# Patient Record
Sex: Male | Born: 1935 | Race: White | Hispanic: No | Marital: Married | Smoking: Former smoker
Health system: Southern US, Community
[De-identification: ages and names within clinical notes are randomized; demographics above are authoritative.]

## PROBLEM LIST (undated history)

## (undated) DIAGNOSIS — Z9861 Coronary angioplasty status: Secondary | ICD-10-CM

## (undated) DIAGNOSIS — I519 Heart disease, unspecified: Secondary | ICD-10-CM

## (undated) DIAGNOSIS — E785 Hyperlipidemia, unspecified: Secondary | ICD-10-CM

## (undated) DIAGNOSIS — I10 Essential (primary) hypertension: Secondary | ICD-10-CM

## (undated) DIAGNOSIS — K219 Gastro-esophageal reflux disease without esophagitis: Secondary | ICD-10-CM

## (undated) DIAGNOSIS — C8190 Hodgkin lymphoma, unspecified, unspecified site: Secondary | ICD-10-CM

## (undated) DIAGNOSIS — I2510 Atherosclerotic heart disease of native coronary artery without angina pectoris: Secondary | ICD-10-CM

## (undated) HISTORY — DX: Coronary angioplasty status: Z98.61

## (undated) HISTORY — DX: Essential (primary) hypertension: I10

## (undated) HISTORY — PX: SHOULDER SURGERY: SHX246

## (undated) HISTORY — DX: Atherosclerotic heart disease of native coronary artery without angina pectoris: I25.10

## (undated) HISTORY — DX: Heart disease, unspecified: I51.9

## (undated) HISTORY — PX: APPENDECTOMY: SHX54

## (undated) HISTORY — DX: Gastro-esophageal reflux disease without esophagitis: K21.9

## (undated) HISTORY — DX: Hodgkin lymphoma, unspecified, unspecified site: C81.90

## (undated) HISTORY — PX: TONSILLECTOMY: SUR1361

## (undated) HISTORY — PX: CORONARY ANGIOPLASTY: SHX604

## (undated) HISTORY — DX: Hyperlipidemia, unspecified: E78.5

---
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
## 2003-01-04 HISTORY — PX: CORONARY ANGIOPLASTY: SHX604

## 2008-03-05 ENCOUNTER — Encounter: Payer: Self-pay | Admitting: Cardiology

## 2008-03-07 ENCOUNTER — Encounter: Payer: Self-pay | Admitting: Cardiology

## 2008-03-10 ENCOUNTER — Ambulatory Visit: Payer: Self-pay | Admitting: Vascular Surgery

## 2008-12-29 ENCOUNTER — Emergency Department (HOSPITAL_BASED_OUTPATIENT_CLINIC_OR_DEPARTMENT_OTHER): Admission: EM | Admit: 2008-12-29 | Discharge: 2008-12-29 | Payer: Self-pay | Admitting: Emergency Medicine

## 2008-12-29 ENCOUNTER — Ambulatory Visit: Payer: Self-pay | Admitting: Diagnostic Radiology

## 2009-01-29 ENCOUNTER — Encounter: Admission: RE | Admit: 2009-01-29 | Discharge: 2009-01-29 | Payer: Self-pay | Admitting: Family Medicine

## 2010-04-05 LAB — BASIC METABOLIC PANEL
BUN: 16 mg/dL (ref 6–23)
CO2: 29 mEq/L (ref 19–32)
Calcium: 9.3 mg/dL (ref 8.4–10.5)
Chloride: 100 mEq/L (ref 96–112)
Creatinine, Ser: 0.8 mg/dL (ref 0.4–1.5)
GFR calc Af Amer: >60 mL/min (ref >60)
GFR calc non Af Amer: >60 mL/min (ref >60)
Glucose, Bld: 92 mg/dL (ref 70–99)
Potassium: 4 mEq/L (ref 3.5–5.1)
Sodium: 141 mEq/L (ref 135–145)

## 2010-04-05 LAB — DIFFERENTIAL
Basophils Absolute: 0.1 10*3/uL (ref 0.0–0.1)
Basophils Relative: 1 % (ref 0–1)
Eosinophils Absolute: 0.3 10*3/uL (ref 0.0–0.7)
Eosinophils Relative: 4 % (ref 0–5)
Lymphocytes Relative: 25 % (ref 12–46)
Lymphs Abs: 1.8 10*3/uL (ref 0.7–4.0)
Monocytes Absolute: 0.7 10*3/uL (ref 0.1–1.0)
Monocytes Relative: 10 % (ref 3–12)
Neutro Abs: 4.3 10*3/uL (ref 1.7–7.7)
Neutrophils Relative %: 60 % (ref 43–77)

## 2010-04-05 LAB — POCT CARDIAC MARKERS
CKMB, poc: 1.6 ng/mL (ref 1.0–8.0)
Myoglobin, poc: 72.8 ng/mL (ref 12–200)
Troponin i, poc: <0.05 ng/mL (ref 0.00–0.09)

## 2010-04-05 LAB — CBC
HCT: 44.4 % (ref 39.0–52.0)
Hemoglobin: 14.8 g/dL (ref 13.0–17.0)
MCHC: 33.3 g/dL (ref 30.0–36.0)
MCV: 93 fL (ref 78.0–100.0)
Platelets: 169 10*3/uL (ref 150–400)
RBC: 4.77 MIL/uL (ref 4.22–5.81)
RDW: 13.2 % (ref 11.5–15.5)
WBC: 7.2 10*3/uL (ref 4.0–10.5)

## 2010-05-18 NOTE — Procedures (Signed)
CAROTID DUPLEX EXAM   INDICATION:  Carotid bruit.   HISTORY:  Diabetes:  No.  Cardiac:  Stent.  Hypertension:  Yes.  Smoking:  Quit approximately 50 years ago.  Previous Surgery:  No carotid surgery.  CV History:  No.  Amaurosis Fugax No, Paresthesias No, Hemiparesis No.                                       RIGHT             LEFT  Brachial systolic pressure:         144               140  Brachial Doppler waveforms:         WNL               WNL  Vertebral direction of flow:        Antegrade         Antegrade  DUPLEX VELOCITIES (cm/sec)  CCA peak systolic                   83                133  ECA peak systolic                   137               119  ICA peak systolic                   147               89  ICA end diastolic                   41                18  PLAQUE MORPHOLOGY:                  Heterogenous      Heterogenous  PLAQUE AMOUNT:                      Mild/moderate     Mild  PLAQUE LOCATION:                    ICA, ECA          ICA, ECA   IMPRESSION:  1. Right internal carotid artery shows evidence of 40-59% stenosis.  2. Left internal carotid artery shows evidence of 20-39% stenosis.     ___________________________________________  Janetta Hora Fields, MD   AS/MEDQ  D:  03/10/2008  T:  03/10/2008  Job:  [id number]

## 2011-06-04 ENCOUNTER — Encounter: Payer: Self-pay | Admitting: *Deleted

## 2011-11-07 ENCOUNTER — Encounter: Payer: Self-pay | Admitting: Vascular Surgery

## 2015-01-08 DIAGNOSIS — M7742 Metatarsalgia, left foot: Secondary | ICD-10-CM | POA: Diagnosis not present

## 2015-01-08 DIAGNOSIS — M79675 Pain in left toe(s): Secondary | ICD-10-CM | POA: Diagnosis not present

## 2015-01-08 DIAGNOSIS — R7303 Prediabetes: Secondary | ICD-10-CM | POA: Diagnosis not present

## 2015-01-08 DIAGNOSIS — M1990 Unspecified osteoarthritis, unspecified site: Secondary | ICD-10-CM | POA: Diagnosis not present

## 2015-01-08 DIAGNOSIS — I10 Essential (primary) hypertension: Secondary | ICD-10-CM | POA: Diagnosis not present

## 2015-01-08 DIAGNOSIS — L602 Onychogryphosis: Secondary | ICD-10-CM | POA: Diagnosis not present

## 2015-01-27 DIAGNOSIS — G4733 Obstructive sleep apnea (adult) (pediatric): Secondary | ICD-10-CM | POA: Diagnosis not present

## 2015-02-06 DIAGNOSIS — Z888 Allergy status to other drugs, medicaments and biological substances status: Secondary | ICD-10-CM | POA: Diagnosis not present

## 2015-02-06 DIAGNOSIS — Z882 Allergy status to sulfonamides status: Secondary | ICD-10-CM | POA: Diagnosis not present

## 2015-02-06 DIAGNOSIS — G8929 Other chronic pain: Secondary | ICD-10-CM | POA: Diagnosis not present

## 2015-02-06 DIAGNOSIS — Z79899 Other long term (current) drug therapy: Secondary | ICD-10-CM | POA: Diagnosis not present

## 2015-02-06 DIAGNOSIS — R471 Dysarthria and anarthria: Secondary | ICD-10-CM | POA: Diagnosis not present

## 2015-02-06 DIAGNOSIS — Z7982 Long term (current) use of aspirin: Secondary | ICD-10-CM | POA: Diagnosis not present

## 2015-02-06 DIAGNOSIS — E785 Hyperlipidemia, unspecified: Secondary | ICD-10-CM | POA: Diagnosis not present

## 2015-02-06 DIAGNOSIS — R410 Disorientation, unspecified: Secondary | ICD-10-CM | POA: Diagnosis not present

## 2015-02-06 DIAGNOSIS — N3281 Overactive bladder: Secondary | ICD-10-CM | POA: Diagnosis not present

## 2015-02-06 DIAGNOSIS — I6523 Occlusion and stenosis of bilateral carotid arteries: Secondary | ICD-10-CM | POA: Diagnosis not present

## 2015-02-06 DIAGNOSIS — I252 Old myocardial infarction: Secondary | ICD-10-CM | POA: Diagnosis not present

## 2015-02-06 DIAGNOSIS — I080 Rheumatic disorders of both mitral and aortic valves: Secondary | ICD-10-CM | POA: Diagnosis not present

## 2015-02-06 DIAGNOSIS — Z87891 Personal history of nicotine dependence: Secondary | ICD-10-CM | POA: Diagnosis not present

## 2015-02-06 DIAGNOSIS — G319 Degenerative disease of nervous system, unspecified: Secondary | ICD-10-CM | POA: Diagnosis not present

## 2015-02-06 DIAGNOSIS — G40909 Epilepsy, unspecified, not intractable, without status epilepticus: Secondary | ICD-10-CM | POA: Diagnosis not present

## 2015-02-06 DIAGNOSIS — K219 Gastro-esophageal reflux disease without esophagitis: Secondary | ICD-10-CM | POA: Diagnosis not present

## 2015-02-06 DIAGNOSIS — R9082 White matter disease, unspecified: Secondary | ICD-10-CM | POA: Diagnosis not present

## 2015-02-06 DIAGNOSIS — M545 Low back pain: Secondary | ICD-10-CM | POA: Diagnosis not present

## 2015-02-06 DIAGNOSIS — Z8673 Personal history of transient ischemic attack (TIA), and cerebral infarction without residual deficits: Secondary | ICD-10-CM | POA: Diagnosis not present

## 2015-02-06 DIAGNOSIS — I10 Essential (primary) hypertension: Secondary | ICD-10-CM | POA: Diagnosis not present

## 2015-02-06 DIAGNOSIS — R569 Unspecified convulsions: Secondary | ICD-10-CM | POA: Diagnosis not present

## 2015-02-06 DIAGNOSIS — Z886 Allergy status to analgesic agent status: Secondary | ICD-10-CM | POA: Diagnosis not present

## 2015-02-06 DIAGNOSIS — G459 Transient cerebral ischemic attack, unspecified: Secondary | ICD-10-CM | POA: Diagnosis not present

## 2015-02-06 DIAGNOSIS — R402411 Glasgow coma scale score 13-15, in the field [EMT or ambulance]: Secondary | ICD-10-CM | POA: Diagnosis not present

## 2015-02-06 DIAGNOSIS — G451 Carotid artery syndrome (hemispheric): Secondary | ICD-10-CM | POA: Diagnosis not present

## 2015-02-06 DIAGNOSIS — M1990 Unspecified osteoarthritis, unspecified site: Secondary | ICD-10-CM | POA: Diagnosis not present

## 2015-02-06 DIAGNOSIS — G40209 Localization-related (focal) (partial) symptomatic epilepsy and epileptic syndromes with complex partial seizures, not intractable, without status epilepticus: Secondary | ICD-10-CM | POA: Diagnosis not present

## 2015-02-06 DIAGNOSIS — C8190 Hodgkin lymphoma, unspecified, unspecified site: Secondary | ICD-10-CM | POA: Diagnosis not present

## 2015-02-06 DIAGNOSIS — J309 Allergic rhinitis, unspecified: Secondary | ICD-10-CM | POA: Diagnosis not present

## 2015-02-06 DIAGNOSIS — R4781 Slurred speech: Secondary | ICD-10-CM | POA: Diagnosis not present

## 2015-02-06 DIAGNOSIS — Z8669 Personal history of other diseases of the nervous system and sense organs: Secondary | ICD-10-CM | POA: Diagnosis not present

## 2015-02-06 DIAGNOSIS — I2510 Atherosclerotic heart disease of native coronary artery without angina pectoris: Secondary | ICD-10-CM | POA: Diagnosis not present

## 2015-02-06 DIAGNOSIS — G250 Essential tremor: Secondary | ICD-10-CM | POA: Diagnosis not present

## 2015-02-06 DIAGNOSIS — N400 Enlarged prostate without lower urinary tract symptoms: Secondary | ICD-10-CM | POA: Diagnosis not present

## 2015-02-06 DIAGNOSIS — Z885 Allergy status to narcotic agent status: Secondary | ICD-10-CM | POA: Diagnosis not present

## 2015-02-06 DIAGNOSIS — G4733 Obstructive sleep apnea (adult) (pediatric): Secondary | ICD-10-CM | POA: Diagnosis not present

## 2015-02-06 DIAGNOSIS — R4789 Other speech disturbances: Secondary | ICD-10-CM | POA: Diagnosis not present

## 2015-02-07 DIAGNOSIS — R4789 Other speech disturbances: Secondary | ICD-10-CM | POA: Diagnosis not present

## 2015-02-07 DIAGNOSIS — I519 Heart disease, unspecified: Secondary | ICD-10-CM | POA: Diagnosis not present

## 2015-02-07 DIAGNOSIS — I083 Combined rheumatic disorders of mitral, aortic and tricuspid valves: Secondary | ICD-10-CM | POA: Diagnosis not present

## 2015-02-07 DIAGNOSIS — J341 Cyst and mucocele of nose and nasal sinus: Secondary | ICD-10-CM | POA: Diagnosis not present

## 2015-02-07 DIAGNOSIS — M5440 Lumbago with sciatica, unspecified side: Secondary | ICD-10-CM | POA: Diagnosis not present

## 2015-02-07 DIAGNOSIS — I639 Cerebral infarction, unspecified: Secondary | ICD-10-CM | POA: Diagnosis not present

## 2015-02-07 DIAGNOSIS — G459 Transient cerebral ischemic attack, unspecified: Secondary | ICD-10-CM | POA: Diagnosis not present

## 2015-02-07 DIAGNOSIS — G8929 Other chronic pain: Secondary | ICD-10-CM | POA: Diagnosis not present

## 2015-02-07 DIAGNOSIS — I10 Essential (primary) hypertension: Secondary | ICD-10-CM | POA: Diagnosis not present

## 2015-02-07 DIAGNOSIS — I2510 Atherosclerotic heart disease of native coronary artery without angina pectoris: Secondary | ICD-10-CM | POA: Diagnosis not present

## 2015-02-07 DIAGNOSIS — Z9861 Coronary angioplasty status: Secondary | ICD-10-CM | POA: Diagnosis not present

## 2015-02-07 DIAGNOSIS — I517 Cardiomegaly: Secondary | ICD-10-CM | POA: Diagnosis not present

## 2015-02-11 DIAGNOSIS — G451 Carotid artery syndrome (hemispheric): Secondary | ICD-10-CM | POA: Diagnosis not present

## 2015-02-11 DIAGNOSIS — I10 Essential (primary) hypertension: Secondary | ICD-10-CM | POA: Diagnosis not present

## 2015-02-11 DIAGNOSIS — F419 Anxiety disorder, unspecified: Secondary | ICD-10-CM | POA: Diagnosis not present

## 2015-02-11 DIAGNOSIS — K219 Gastro-esophageal reflux disease without esophagitis: Secondary | ICD-10-CM | POA: Diagnosis not present

## 2015-02-18 DIAGNOSIS — G451 Carotid artery syndrome (hemispheric): Secondary | ICD-10-CM | POA: Diagnosis not present

## 2015-02-18 DIAGNOSIS — I6521 Occlusion and stenosis of right carotid artery: Secondary | ICD-10-CM | POA: Diagnosis not present

## 2015-02-18 DIAGNOSIS — I10 Essential (primary) hypertension: Secondary | ICD-10-CM | POA: Diagnosis not present

## 2015-03-24 DIAGNOSIS — I10 Essential (primary) hypertension: Secondary | ICD-10-CM | POA: Diagnosis not present

## 2015-03-24 DIAGNOSIS — G250 Essential tremor: Secondary | ICD-10-CM | POA: Diagnosis not present

## 2015-03-24 DIAGNOSIS — I6521 Occlusion and stenosis of right carotid artery: Secondary | ICD-10-CM | POA: Diagnosis not present

## 2015-03-25 DIAGNOSIS — H35373 Puckering of macula, bilateral: Secondary | ICD-10-CM | POA: Diagnosis not present

## 2015-03-25 DIAGNOSIS — H5712 Ocular pain, left eye: Secondary | ICD-10-CM | POA: Diagnosis not present

## 2015-03-25 DIAGNOSIS — H401121 Primary open-angle glaucoma, left eye, mild stage: Secondary | ICD-10-CM | POA: Diagnosis not present

## 2015-03-25 DIAGNOSIS — H40011 Open angle with borderline findings, low risk, right eye: Secondary | ICD-10-CM | POA: Diagnosis not present

## 2015-03-25 DIAGNOSIS — H04123 Dry eye syndrome of bilateral lacrimal glands: Secondary | ICD-10-CM | POA: Diagnosis not present

## 2015-04-02 DIAGNOSIS — S90121A Contusion of right lesser toe(s) without damage to nail, initial encounter: Secondary | ICD-10-CM | POA: Diagnosis not present

## 2015-04-02 DIAGNOSIS — M79671 Pain in right foot: Secondary | ICD-10-CM | POA: Diagnosis not present

## 2015-04-03 DIAGNOSIS — M25641 Stiffness of right hand, not elsewhere classified: Secondary | ICD-10-CM | POA: Diagnosis not present

## 2015-04-03 DIAGNOSIS — E785 Hyperlipidemia, unspecified: Secondary | ICD-10-CM | POA: Diagnosis not present

## 2015-04-03 DIAGNOSIS — I2510 Atherosclerotic heart disease of native coronary artery without angina pectoris: Secondary | ICD-10-CM | POA: Diagnosis not present

## 2015-04-03 DIAGNOSIS — Z8673 Personal history of transient ischemic attack (TIA), and cerebral infarction without residual deficits: Secondary | ICD-10-CM | POA: Diagnosis not present

## 2015-04-03 DIAGNOSIS — Z87891 Personal history of nicotine dependence: Secondary | ICD-10-CM | POA: Diagnosis not present

## 2015-04-03 DIAGNOSIS — G8929 Other chronic pain: Secondary | ICD-10-CM | POA: Diagnosis not present

## 2015-04-03 DIAGNOSIS — Z888 Allergy status to other drugs, medicaments and biological substances status: Secondary | ICD-10-CM | POA: Diagnosis not present

## 2015-04-03 DIAGNOSIS — R569 Unspecified convulsions: Secondary | ICD-10-CM | POA: Diagnosis not present

## 2015-04-03 DIAGNOSIS — I10 Essential (primary) hypertension: Secondary | ICD-10-CM | POA: Diagnosis not present

## 2015-04-03 DIAGNOSIS — Z7902 Long term (current) use of antithrombotics/antiplatelets: Secondary | ICD-10-CM | POA: Diagnosis not present

## 2015-04-03 DIAGNOSIS — I252 Old myocardial infarction: Secondary | ICD-10-CM | POA: Diagnosis not present

## 2015-04-03 DIAGNOSIS — Z7982 Long term (current) use of aspirin: Secondary | ICD-10-CM | POA: Diagnosis not present

## 2015-04-03 DIAGNOSIS — M545 Low back pain: Secondary | ICD-10-CM | POA: Diagnosis not present

## 2015-04-03 DIAGNOSIS — I452 Bifascicular block: Secondary | ICD-10-CM | POA: Diagnosis not present

## 2015-04-03 DIAGNOSIS — J322 Chronic ethmoidal sinusitis: Secondary | ICD-10-CM | POA: Diagnosis not present

## 2015-04-03 DIAGNOSIS — G4730 Sleep apnea, unspecified: Secondary | ICD-10-CM | POA: Diagnosis not present

## 2015-04-03 DIAGNOSIS — G459 Transient cerebral ischemic attack, unspecified: Secondary | ICD-10-CM | POA: Diagnosis not present

## 2015-04-03 DIAGNOSIS — N400 Enlarged prostate without lower urinary tract symptoms: Secondary | ICD-10-CM | POA: Diagnosis not present

## 2015-04-03 DIAGNOSIS — Z96651 Presence of right artificial knee joint: Secondary | ICD-10-CM | POA: Diagnosis not present

## 2015-04-03 DIAGNOSIS — K219 Gastro-esophageal reflux disease without esophagitis: Secondary | ICD-10-CM | POA: Diagnosis not present

## 2015-04-03 DIAGNOSIS — Z9049 Acquired absence of other specified parts of digestive tract: Secondary | ICD-10-CM | POA: Diagnosis not present

## 2015-04-03 DIAGNOSIS — R079 Chest pain, unspecified: Secondary | ICD-10-CM | POA: Diagnosis not present

## 2015-04-03 DIAGNOSIS — Z7951 Long term (current) use of inhaled steroids: Secondary | ICD-10-CM | POA: Diagnosis not present

## 2015-04-03 DIAGNOSIS — G250 Essential tremor: Secondary | ICD-10-CM | POA: Diagnosis not present

## 2015-04-03 DIAGNOSIS — Z79899 Other long term (current) drug therapy: Secondary | ICD-10-CM | POA: Diagnosis not present

## 2015-04-03 DIAGNOSIS — R410 Disorientation, unspecified: Secondary | ICD-10-CM | POA: Diagnosis not present

## 2015-04-03 DIAGNOSIS — Z955 Presence of coronary angioplasty implant and graft: Secondary | ICD-10-CM | POA: Diagnosis not present

## 2015-04-03 DIAGNOSIS — Z8572 Personal history of non-Hodgkin lymphomas: Secondary | ICD-10-CM | POA: Diagnosis not present

## 2015-04-03 DIAGNOSIS — G40209 Localization-related (focal) (partial) symptomatic epilepsy and epileptic syndromes with complex partial seizures, not intractable, without status epilepticus: Secondary | ICD-10-CM | POA: Diagnosis not present

## 2015-04-04 DIAGNOSIS — M25641 Stiffness of right hand, not elsewhere classified: Secondary | ICD-10-CM | POA: Diagnosis not present

## 2015-04-04 DIAGNOSIS — I517 Cardiomegaly: Secondary | ICD-10-CM | POA: Diagnosis not present

## 2015-04-04 DIAGNOSIS — G40209 Localization-related (focal) (partial) symptomatic epilepsy and epileptic syndromes with complex partial seizures, not intractable, without status epilepticus: Secondary | ICD-10-CM | POA: Diagnosis not present

## 2015-04-04 DIAGNOSIS — I519 Heart disease, unspecified: Secondary | ICD-10-CM | POA: Diagnosis not present

## 2015-04-04 DIAGNOSIS — I083 Combined rheumatic disorders of mitral, aortic and tricuspid valves: Secondary | ICD-10-CM | POA: Diagnosis not present

## 2015-04-04 DIAGNOSIS — R4189 Other symptoms and signs involving cognitive functions and awareness: Secondary | ICD-10-CM | POA: Diagnosis not present

## 2015-04-07 DIAGNOSIS — M722 Plantar fascial fibromatosis: Secondary | ICD-10-CM | POA: Diagnosis not present

## 2015-04-07 DIAGNOSIS — M76821 Posterior tibial tendinitis, right leg: Secondary | ICD-10-CM | POA: Diagnosis not present

## 2015-04-09 DIAGNOSIS — I2510 Atherosclerotic heart disease of native coronary artery without angina pectoris: Secondary | ICD-10-CM | POA: Diagnosis not present

## 2015-04-09 DIAGNOSIS — G4733 Obstructive sleep apnea (adult) (pediatric): Secondary | ICD-10-CM | POA: Diagnosis not present

## 2015-04-09 DIAGNOSIS — G250 Essential tremor: Secondary | ICD-10-CM | POA: Diagnosis not present

## 2015-04-09 DIAGNOSIS — Z9989 Dependence on other enabling machines and devices: Secondary | ICD-10-CM | POA: Diagnosis not present

## 2015-04-09 DIAGNOSIS — G40209 Localization-related (focal) (partial) symptomatic epilepsy and epileptic syndromes with complex partial seizures, not intractable, without status epilepticus: Secondary | ICD-10-CM | POA: Diagnosis not present

## 2015-04-09 DIAGNOSIS — Z9861 Coronary angioplasty status: Secondary | ICD-10-CM | POA: Diagnosis not present

## 2015-04-09 DIAGNOSIS — K5909 Other constipation: Secondary | ICD-10-CM | POA: Diagnosis not present

## 2015-04-15 DIAGNOSIS — B9789 Other viral agents as the cause of diseases classified elsewhere: Secondary | ICD-10-CM | POA: Diagnosis not present

## 2015-04-15 DIAGNOSIS — J069 Acute upper respiratory infection, unspecified: Secondary | ICD-10-CM | POA: Diagnosis not present

## 2015-06-01 DIAGNOSIS — G40209 Localization-related (focal) (partial) symptomatic epilepsy and epileptic syndromes with complex partial seizures, not intractable, without status epilepticus: Secondary | ICD-10-CM | POA: Diagnosis not present

## 2015-06-01 DIAGNOSIS — I10 Essential (primary) hypertension: Secondary | ICD-10-CM | POA: Diagnosis not present

## 2015-06-01 DIAGNOSIS — R569 Unspecified convulsions: Secondary | ICD-10-CM | POA: Diagnosis not present

## 2015-06-01 DIAGNOSIS — G8929 Other chronic pain: Secondary | ICD-10-CM | POA: Diagnosis not present

## 2015-06-01 DIAGNOSIS — M545 Low back pain: Secondary | ICD-10-CM | POA: Diagnosis not present

## 2015-06-01 DIAGNOSIS — R253 Fasciculation: Secondary | ICD-10-CM | POA: Diagnosis not present

## 2015-06-01 DIAGNOSIS — I2510 Atherosclerotic heart disease of native coronary artery without angina pectoris: Secondary | ICD-10-CM | POA: Diagnosis not present

## 2015-06-01 DIAGNOSIS — Z7902 Long term (current) use of antithrombotics/antiplatelets: Secondary | ICD-10-CM | POA: Diagnosis not present

## 2015-06-01 DIAGNOSIS — K219 Gastro-esophageal reflux disease without esophagitis: Secondary | ICD-10-CM | POA: Diagnosis not present

## 2015-06-01 DIAGNOSIS — Z87891 Personal history of nicotine dependence: Secondary | ICD-10-CM | POA: Diagnosis not present

## 2015-06-01 DIAGNOSIS — G514 Facial myokymia: Secondary | ICD-10-CM | POA: Diagnosis not present

## 2015-06-01 DIAGNOSIS — R410 Disorientation, unspecified: Secondary | ICD-10-CM | POA: Diagnosis not present

## 2015-06-01 DIAGNOSIS — I639 Cerebral infarction, unspecified: Secondary | ICD-10-CM | POA: Diagnosis not present

## 2015-06-01 DIAGNOSIS — N400 Enlarged prostate without lower urinary tract symptoms: Secondary | ICD-10-CM | POA: Diagnosis not present

## 2015-06-01 DIAGNOSIS — R531 Weakness: Secondary | ICD-10-CM | POA: Diagnosis not present

## 2015-06-01 DIAGNOSIS — G459 Transient cerebral ischemic attack, unspecified: Secondary | ICD-10-CM | POA: Diagnosis not present

## 2015-06-01 DIAGNOSIS — I252 Old myocardial infarction: Secondary | ICD-10-CM | POA: Diagnosis not present

## 2015-06-01 DIAGNOSIS — Z7951 Long term (current) use of inhaled steroids: Secondary | ICD-10-CM | POA: Diagnosis not present

## 2015-06-01 DIAGNOSIS — Z79899 Other long term (current) drug therapy: Secondary | ICD-10-CM | POA: Diagnosis not present

## 2015-06-01 DIAGNOSIS — G4733 Obstructive sleep apnea (adult) (pediatric): Secondary | ICD-10-CM | POA: Diagnosis not present

## 2015-06-01 DIAGNOSIS — Z955 Presence of coronary angioplasty implant and graft: Secondary | ICD-10-CM | POA: Diagnosis not present

## 2015-06-01 DIAGNOSIS — R29898 Other symptoms and signs involving the musculoskeletal system: Secondary | ICD-10-CM | POA: Diagnosis not present

## 2015-06-01 DIAGNOSIS — Z888 Allergy status to other drugs, medicaments and biological substances status: Secondary | ICD-10-CM | POA: Diagnosis not present

## 2015-06-01 DIAGNOSIS — R202 Paresthesia of skin: Secondary | ICD-10-CM | POA: Diagnosis not present

## 2015-06-01 DIAGNOSIS — Z951 Presence of aortocoronary bypass graft: Secondary | ICD-10-CM | POA: Diagnosis not present

## 2015-06-01 DIAGNOSIS — Z8571 Personal history of Hodgkin lymphoma: Secondary | ICD-10-CM | POA: Diagnosis not present

## 2015-06-01 DIAGNOSIS — R079 Chest pain, unspecified: Secondary | ICD-10-CM | POA: Diagnosis not present

## 2015-06-01 DIAGNOSIS — E785 Hyperlipidemia, unspecified: Secondary | ICD-10-CM | POA: Diagnosis not present

## 2015-06-01 DIAGNOSIS — Z885 Allergy status to narcotic agent status: Secondary | ICD-10-CM | POA: Diagnosis not present

## 2015-06-01 DIAGNOSIS — Z7982 Long term (current) use of aspirin: Secondary | ICD-10-CM | POA: Diagnosis not present

## 2015-06-01 DIAGNOSIS — R258 Other abnormal involuntary movements: Secondary | ICD-10-CM | POA: Diagnosis not present

## 2015-06-01 DIAGNOSIS — N3281 Overactive bladder: Secondary | ICD-10-CM | POA: Diagnosis not present

## 2015-06-02 DIAGNOSIS — R531 Weakness: Secondary | ICD-10-CM | POA: Diagnosis not present

## 2015-06-02 DIAGNOSIS — R253 Fasciculation: Secondary | ICD-10-CM | POA: Diagnosis not present

## 2015-06-02 DIAGNOSIS — G514 Facial myokymia: Secondary | ICD-10-CM | POA: Diagnosis not present

## 2015-06-02 DIAGNOSIS — G40209 Localization-related (focal) (partial) symptomatic epilepsy and epileptic syndromes with complex partial seizures, not intractable, without status epilepticus: Secondary | ICD-10-CM | POA: Diagnosis not present

## 2015-06-02 DIAGNOSIS — R258 Other abnormal involuntary movements: Secondary | ICD-10-CM | POA: Diagnosis not present

## 2015-06-02 DIAGNOSIS — G40219 Localization-related (focal) (partial) symptomatic epilepsy and epileptic syndromes with complex partial seizures, intractable, without status epilepticus: Secondary | ICD-10-CM | POA: Diagnosis not present

## 2015-06-16 DIAGNOSIS — G8929 Other chronic pain: Secondary | ICD-10-CM | POA: Diagnosis not present

## 2015-06-16 DIAGNOSIS — M545 Low back pain: Secondary | ICD-10-CM | POA: Diagnosis not present

## 2015-06-16 DIAGNOSIS — M47816 Spondylosis without myelopathy or radiculopathy, lumbar region: Secondary | ICD-10-CM | POA: Diagnosis not present

## 2015-06-22 DIAGNOSIS — G4733 Obstructive sleep apnea (adult) (pediatric): Secondary | ICD-10-CM | POA: Diagnosis not present

## 2015-06-27 DIAGNOSIS — Z885 Allergy status to narcotic agent status: Secondary | ICD-10-CM | POA: Diagnosis not present

## 2015-06-27 DIAGNOSIS — I2510 Atherosclerotic heart disease of native coronary artery without angina pectoris: Secondary | ICD-10-CM | POA: Diagnosis not present

## 2015-06-27 DIAGNOSIS — Z7951 Long term (current) use of inhaled steroids: Secondary | ICD-10-CM | POA: Diagnosis not present

## 2015-06-27 DIAGNOSIS — Z79899 Other long term (current) drug therapy: Secondary | ICD-10-CM | POA: Diagnosis not present

## 2015-06-27 DIAGNOSIS — G4730 Sleep apnea, unspecified: Secondary | ICD-10-CM | POA: Diagnosis not present

## 2015-06-27 DIAGNOSIS — I252 Old myocardial infarction: Secondary | ICD-10-CM | POA: Diagnosis not present

## 2015-06-27 DIAGNOSIS — Z886 Allergy status to analgesic agent status: Secondary | ICD-10-CM | POA: Diagnosis not present

## 2015-06-27 DIAGNOSIS — Z8673 Personal history of transient ischemic attack (TIA), and cerebral infarction without residual deficits: Secondary | ICD-10-CM | POA: Diagnosis not present

## 2015-06-27 DIAGNOSIS — I10 Essential (primary) hypertension: Secondary | ICD-10-CM | POA: Diagnosis not present

## 2015-06-27 DIAGNOSIS — R569 Unspecified convulsions: Secondary | ICD-10-CM | POA: Diagnosis not present

## 2015-06-27 DIAGNOSIS — H748X3 Other specified disorders of middle ear and mastoid, bilateral: Secondary | ICD-10-CM | POA: Diagnosis not present

## 2015-06-27 DIAGNOSIS — Z8571 Personal history of Hodgkin lymphoma: Secondary | ICD-10-CM | POA: Diagnosis not present

## 2015-06-27 DIAGNOSIS — Z87891 Personal history of nicotine dependence: Secondary | ICD-10-CM | POA: Diagnosis not present

## 2015-06-27 DIAGNOSIS — G40209 Localization-related (focal) (partial) symptomatic epilepsy and epileptic syndromes with complex partial seizures, not intractable, without status epilepticus: Secondary | ICD-10-CM | POA: Diagnosis not present

## 2015-06-27 DIAGNOSIS — R410 Disorientation, unspecified: Secondary | ICD-10-CM | POA: Diagnosis not present

## 2015-06-27 DIAGNOSIS — R251 Tremor, unspecified: Secondary | ICD-10-CM | POA: Diagnosis not present

## 2015-06-27 DIAGNOSIS — Z7902 Long term (current) use of antithrombotics/antiplatelets: Secondary | ICD-10-CM | POA: Diagnosis not present

## 2015-06-27 DIAGNOSIS — Z96651 Presence of right artificial knee joint: Secondary | ICD-10-CM | POA: Diagnosis not present

## 2015-06-27 DIAGNOSIS — E785 Hyperlipidemia, unspecified: Secondary | ICD-10-CM | POA: Diagnosis not present

## 2015-06-27 DIAGNOSIS — M545 Low back pain: Secondary | ICD-10-CM | POA: Diagnosis not present

## 2015-06-27 DIAGNOSIS — G8929 Other chronic pain: Secondary | ICD-10-CM | POA: Diagnosis not present

## 2015-06-27 DIAGNOSIS — K219 Gastro-esophageal reflux disease without esophagitis: Secondary | ICD-10-CM | POA: Diagnosis not present

## 2015-06-27 DIAGNOSIS — Z951 Presence of aortocoronary bypass graft: Secondary | ICD-10-CM | POA: Diagnosis not present

## 2015-06-27 DIAGNOSIS — R29810 Facial weakness: Secondary | ICD-10-CM | POA: Diagnosis not present

## 2015-06-27 DIAGNOSIS — N400 Enlarged prostate without lower urinary tract symptoms: Secondary | ICD-10-CM | POA: Diagnosis not present

## 2015-06-27 DIAGNOSIS — Z888 Allergy status to other drugs, medicaments and biological substances status: Secondary | ICD-10-CM | POA: Diagnosis not present

## 2015-06-27 DIAGNOSIS — E871 Hypo-osmolality and hyponatremia: Secondary | ICD-10-CM | POA: Diagnosis not present

## 2015-06-30 DIAGNOSIS — G40109 Localization-related (focal) (partial) symptomatic epilepsy and epileptic syndromes with simple partial seizures, not intractable, without status epilepticus: Secondary | ICD-10-CM | POA: Diagnosis not present

## 2015-06-30 DIAGNOSIS — G250 Essential tremor: Secondary | ICD-10-CM | POA: Diagnosis not present

## 2015-07-15 DIAGNOSIS — N400 Enlarged prostate without lower urinary tract symptoms: Secondary | ICD-10-CM | POA: Diagnosis not present

## 2015-07-15 DIAGNOSIS — G250 Essential tremor: Secondary | ICD-10-CM | POA: Diagnosis not present

## 2015-07-15 DIAGNOSIS — E871 Hypo-osmolality and hyponatremia: Secondary | ICD-10-CM | POA: Diagnosis not present

## 2015-07-15 DIAGNOSIS — R351 Nocturia: Secondary | ICD-10-CM | POA: Diagnosis not present

## 2015-07-15 DIAGNOSIS — G40209 Localization-related (focal) (partial) symptomatic epilepsy and epileptic syndromes with complex partial seizures, not intractable, without status epilepticus: Secondary | ICD-10-CM | POA: Diagnosis not present

## 2015-07-15 DIAGNOSIS — T424X5A Adverse effect of benzodiazepines, initial encounter: Secondary | ICD-10-CM | POA: Diagnosis not present

## 2015-07-15 DIAGNOSIS — I10 Essential (primary) hypertension: Secondary | ICD-10-CM | POA: Diagnosis not present

## 2015-07-17 DIAGNOSIS — G250 Essential tremor: Secondary | ICD-10-CM | POA: Diagnosis not present

## 2015-07-17 DIAGNOSIS — Z85828 Personal history of other malignant neoplasm of skin: Secondary | ICD-10-CM | POA: Diagnosis not present

## 2015-07-17 DIAGNOSIS — Z7902 Long term (current) use of antithrombotics/antiplatelets: Secondary | ICD-10-CM | POA: Diagnosis not present

## 2015-07-17 DIAGNOSIS — Z87891 Personal history of nicotine dependence: Secondary | ICD-10-CM | POA: Diagnosis not present

## 2015-07-17 DIAGNOSIS — Z7982 Long term (current) use of aspirin: Secondary | ICD-10-CM | POA: Diagnosis not present

## 2015-07-17 DIAGNOSIS — I10 Essential (primary) hypertension: Secondary | ICD-10-CM | POA: Diagnosis not present

## 2015-07-17 DIAGNOSIS — G40209 Localization-related (focal) (partial) symptomatic epilepsy and epileptic syndromes with complex partial seizures, not intractable, without status epilepticus: Secondary | ICD-10-CM | POA: Diagnosis not present

## 2015-07-17 DIAGNOSIS — Z9181 History of falling: Secondary | ICD-10-CM | POA: Diagnosis not present

## 2015-07-17 DIAGNOSIS — I25119 Atherosclerotic heart disease of native coronary artery with unspecified angina pectoris: Secondary | ICD-10-CM | POA: Diagnosis not present

## 2015-07-17 DIAGNOSIS — R2689 Other abnormalities of gait and mobility: Secondary | ICD-10-CM | POA: Diagnosis not present

## 2015-07-22 DIAGNOSIS — Z7982 Long term (current) use of aspirin: Secondary | ICD-10-CM | POA: Diagnosis not present

## 2015-07-22 DIAGNOSIS — Z7902 Long term (current) use of antithrombotics/antiplatelets: Secondary | ICD-10-CM | POA: Diagnosis not present

## 2015-07-22 DIAGNOSIS — R2689 Other abnormalities of gait and mobility: Secondary | ICD-10-CM | POA: Diagnosis not present

## 2015-07-22 DIAGNOSIS — Z85828 Personal history of other malignant neoplasm of skin: Secondary | ICD-10-CM | POA: Diagnosis not present

## 2015-07-22 DIAGNOSIS — Z9181 History of falling: Secondary | ICD-10-CM | POA: Diagnosis not present

## 2015-07-22 DIAGNOSIS — G40209 Localization-related (focal) (partial) symptomatic epilepsy and epileptic syndromes with complex partial seizures, not intractable, without status epilepticus: Secondary | ICD-10-CM | POA: Diagnosis not present

## 2015-07-22 DIAGNOSIS — I25119 Atherosclerotic heart disease of native coronary artery with unspecified angina pectoris: Secondary | ICD-10-CM | POA: Diagnosis not present

## 2015-07-22 DIAGNOSIS — Z8673 Personal history of transient ischemic attack (TIA), and cerebral infarction without residual deficits: Secondary | ICD-10-CM | POA: Diagnosis not present

## 2015-07-22 DIAGNOSIS — G252 Other specified forms of tremor: Secondary | ICD-10-CM | POA: Diagnosis not present

## 2015-07-22 DIAGNOSIS — I10 Essential (primary) hypertension: Secondary | ICD-10-CM | POA: Diagnosis not present

## 2015-07-22 DIAGNOSIS — Z87891 Personal history of nicotine dependence: Secondary | ICD-10-CM | POA: Diagnosis not present

## 2015-07-22 DIAGNOSIS — G250 Essential tremor: Secondary | ICD-10-CM | POA: Diagnosis not present

## 2015-07-28 DIAGNOSIS — G40109 Localization-related (focal) (partial) symptomatic epilepsy and epileptic syndromes with simple partial seizures, not intractable, without status epilepticus: Secondary | ICD-10-CM | POA: Diagnosis not present

## 2015-07-28 DIAGNOSIS — Z5181 Encounter for therapeutic drug level monitoring: Secondary | ICD-10-CM | POA: Diagnosis not present

## 2015-08-03 DIAGNOSIS — M1990 Unspecified osteoarthritis, unspecified site: Secondary | ICD-10-CM | POA: Diagnosis not present

## 2015-08-03 DIAGNOSIS — R2689 Other abnormalities of gait and mobility: Secondary | ICD-10-CM | POA: Diagnosis not present

## 2015-08-03 DIAGNOSIS — M6281 Muscle weakness (generalized): Secondary | ICD-10-CM | POA: Diagnosis not present

## 2015-08-03 DIAGNOSIS — Z9181 History of falling: Secondary | ICD-10-CM | POA: Diagnosis not present

## 2015-08-05 DIAGNOSIS — I10 Essential (primary) hypertension: Secondary | ICD-10-CM | POA: Diagnosis not present

## 2015-08-05 DIAGNOSIS — R2689 Other abnormalities of gait and mobility: Secondary | ICD-10-CM | POA: Diagnosis not present

## 2015-08-05 DIAGNOSIS — N400 Enlarged prostate without lower urinary tract symptoms: Secondary | ICD-10-CM | POA: Diagnosis not present

## 2015-08-05 DIAGNOSIS — G40209 Localization-related (focal) (partial) symptomatic epilepsy and epileptic syndromes with complex partial seizures, not intractable, without status epilepticus: Secondary | ICD-10-CM | POA: Diagnosis not present

## 2015-08-06 DIAGNOSIS — G4733 Obstructive sleep apnea (adult) (pediatric): Secondary | ICD-10-CM | POA: Diagnosis not present

## 2015-08-07 DIAGNOSIS — I10 Essential (primary) hypertension: Secondary | ICD-10-CM | POA: Diagnosis not present

## 2015-08-07 DIAGNOSIS — G40209 Localization-related (focal) (partial) symptomatic epilepsy and epileptic syndromes with complex partial seizures, not intractable, without status epilepticus: Secondary | ICD-10-CM | POA: Diagnosis not present

## 2015-08-07 DIAGNOSIS — Z9181 History of falling: Secondary | ICD-10-CM | POA: Diagnosis not present

## 2015-08-07 DIAGNOSIS — Z87891 Personal history of nicotine dependence: Secondary | ICD-10-CM | POA: Diagnosis not present

## 2015-08-07 DIAGNOSIS — I25119 Atherosclerotic heart disease of native coronary artery with unspecified angina pectoris: Secondary | ICD-10-CM | POA: Diagnosis not present

## 2015-08-07 DIAGNOSIS — Z85828 Personal history of other malignant neoplasm of skin: Secondary | ICD-10-CM | POA: Diagnosis not present

## 2015-08-07 DIAGNOSIS — Z7902 Long term (current) use of antithrombotics/antiplatelets: Secondary | ICD-10-CM | POA: Diagnosis not present

## 2015-08-07 DIAGNOSIS — Z7982 Long term (current) use of aspirin: Secondary | ICD-10-CM | POA: Diagnosis not present

## 2015-08-07 DIAGNOSIS — R2689 Other abnormalities of gait and mobility: Secondary | ICD-10-CM | POA: Diagnosis not present

## 2015-08-07 DIAGNOSIS — G250 Essential tremor: Secondary | ICD-10-CM | POA: Diagnosis not present

## 2015-08-13 DIAGNOSIS — G40909 Epilepsy, unspecified, not intractable, without status epilepticus: Secondary | ICD-10-CM | POA: Diagnosis not present

## 2015-08-24 DIAGNOSIS — K219 Gastro-esophageal reflux disease without esophagitis: Secondary | ICD-10-CM | POA: Diagnosis not present

## 2015-08-24 DIAGNOSIS — Z7951 Long term (current) use of inhaled steroids: Secondary | ICD-10-CM | POA: Diagnosis not present

## 2015-08-24 DIAGNOSIS — M66252 Spontaneous rupture of extensor tendons, left thigh: Secondary | ICD-10-CM | POA: Diagnosis not present

## 2015-08-24 DIAGNOSIS — I2510 Atherosclerotic heart disease of native coronary artery without angina pectoris: Secondary | ICD-10-CM | POA: Diagnosis not present

## 2015-08-24 DIAGNOSIS — I252 Old myocardial infarction: Secondary | ICD-10-CM | POA: Diagnosis not present

## 2015-08-24 DIAGNOSIS — Z9109 Other allergy status, other than to drugs and biological substances: Secondary | ICD-10-CM | POA: Diagnosis not present

## 2015-08-24 DIAGNOSIS — I10 Essential (primary) hypertension: Secondary | ICD-10-CM | POA: Diagnosis not present

## 2015-08-24 DIAGNOSIS — Z885 Allergy status to narcotic agent status: Secondary | ICD-10-CM | POA: Diagnosis not present

## 2015-08-24 DIAGNOSIS — S83272A Complex tear of lateral meniscus, current injury, left knee, initial encounter: Secondary | ICD-10-CM | POA: Diagnosis not present

## 2015-08-24 DIAGNOSIS — R2681 Unsteadiness on feet: Secondary | ICD-10-CM | POA: Diagnosis not present

## 2015-08-24 DIAGNOSIS — M25562 Pain in left knee: Secondary | ICD-10-CM | POA: Diagnosis not present

## 2015-08-24 DIAGNOSIS — B9620 Unspecified Escherichia coli [E. coli] as the cause of diseases classified elsewhere: Secondary | ICD-10-CM | POA: Diagnosis not present

## 2015-08-24 DIAGNOSIS — S8991XA Unspecified injury of right lower leg, initial encounter: Secondary | ICD-10-CM | POA: Diagnosis not present

## 2015-08-24 DIAGNOSIS — R918 Other nonspecific abnormal finding of lung field: Secondary | ICD-10-CM | POA: Diagnosis not present

## 2015-08-24 DIAGNOSIS — Z79899 Other long term (current) drug therapy: Secondary | ICD-10-CM | POA: Diagnosis not present

## 2015-08-24 DIAGNOSIS — G4733 Obstructive sleep apnea (adult) (pediatric): Secondary | ICD-10-CM | POA: Diagnosis not present

## 2015-08-24 DIAGNOSIS — M25462 Effusion, left knee: Secondary | ICD-10-CM | POA: Diagnosis not present

## 2015-08-24 DIAGNOSIS — F419 Anxiety disorder, unspecified: Secondary | ICD-10-CM | POA: Diagnosis not present

## 2015-08-24 DIAGNOSIS — Z87891 Personal history of nicotine dependence: Secondary | ICD-10-CM | POA: Diagnosis not present

## 2015-08-24 DIAGNOSIS — R509 Fever, unspecified: Secondary | ICD-10-CM | POA: Diagnosis not present

## 2015-08-24 DIAGNOSIS — N390 Urinary tract infection, site not specified: Secondary | ICD-10-CM | POA: Diagnosis not present

## 2015-08-24 DIAGNOSIS — Z955 Presence of coronary angioplasty implant and graft: Secondary | ICD-10-CM | POA: Diagnosis not present

## 2015-08-24 DIAGNOSIS — K5900 Constipation, unspecified: Secondary | ICD-10-CM | POA: Diagnosis not present

## 2015-08-24 DIAGNOSIS — Z7902 Long term (current) use of antithrombotics/antiplatelets: Secondary | ICD-10-CM | POA: Diagnosis not present

## 2015-08-24 DIAGNOSIS — G9340 Encephalopathy, unspecified: Secondary | ICD-10-CM | POA: Diagnosis not present

## 2015-08-24 DIAGNOSIS — E785 Hyperlipidemia, unspecified: Secondary | ICD-10-CM | POA: Diagnosis not present

## 2015-08-24 DIAGNOSIS — E871 Hypo-osmolality and hyponatremia: Secondary | ICD-10-CM | POA: Diagnosis not present

## 2015-08-24 DIAGNOSIS — S76112A Strain of left quadriceps muscle, fascia and tendon, initial encounter: Secondary | ICD-10-CM | POA: Diagnosis not present

## 2015-08-24 DIAGNOSIS — S76002A Unspecified injury of muscle, fascia and tendon of left hip, initial encounter: Secondary | ICD-10-CM | POA: Diagnosis not present

## 2015-08-24 DIAGNOSIS — G40209 Localization-related (focal) (partial) symptomatic epilepsy and epileptic syndromes with complex partial seizures, not intractable, without status epilepticus: Secondary | ICD-10-CM | POA: Diagnosis not present

## 2015-08-24 DIAGNOSIS — M1712 Unilateral primary osteoarthritis, left knee: Secondary | ICD-10-CM | POA: Diagnosis not present

## 2015-09-02 DIAGNOSIS — M79605 Pain in left leg: Secondary | ICD-10-CM | POA: Diagnosis not present

## 2015-09-02 DIAGNOSIS — R4189 Other symptoms and signs involving cognitive functions and awareness: Secondary | ICD-10-CM | POA: Diagnosis not present

## 2015-09-02 DIAGNOSIS — M6281 Muscle weakness (generalized): Secondary | ICD-10-CM | POA: Diagnosis not present

## 2015-09-02 DIAGNOSIS — R279 Unspecified lack of coordination: Secondary | ICD-10-CM | POA: Diagnosis not present

## 2015-09-02 DIAGNOSIS — R2681 Unsteadiness on feet: Secondary | ICD-10-CM | POA: Diagnosis not present

## 2015-09-02 DIAGNOSIS — G40209 Localization-related (focal) (partial) symptomatic epilepsy and epileptic syndromes with complex partial seizures, not intractable, without status epilepticus: Secondary | ICD-10-CM | POA: Diagnosis not present

## 2015-09-02 DIAGNOSIS — M25462 Effusion, left knee: Secondary | ICD-10-CM | POA: Diagnosis not present

## 2015-09-02 DIAGNOSIS — G40909 Epilepsy, unspecified, not intractable, without status epilepticus: Secondary | ICD-10-CM | POA: Diagnosis not present

## 2015-09-02 DIAGNOSIS — R41841 Cognitive communication deficit: Secondary | ICD-10-CM | POA: Diagnosis not present

## 2015-09-02 DIAGNOSIS — M66252 Spontaneous rupture of extensor tendons, left thigh: Secondary | ICD-10-CM | POA: Diagnosis not present

## 2015-09-02 DIAGNOSIS — G9340 Encephalopathy, unspecified: Secondary | ICD-10-CM | POA: Diagnosis not present

## 2015-09-04 DIAGNOSIS — M79605 Pain in left leg: Secondary | ICD-10-CM | POA: Diagnosis not present

## 2015-09-04 DIAGNOSIS — R41841 Cognitive communication deficit: Secondary | ICD-10-CM | POA: Diagnosis not present

## 2015-09-04 DIAGNOSIS — G9340 Encephalopathy, unspecified: Secondary | ICD-10-CM | POA: Diagnosis not present

## 2015-09-04 DIAGNOSIS — R279 Unspecified lack of coordination: Secondary | ICD-10-CM | POA: Diagnosis not present

## 2015-09-04 DIAGNOSIS — G40909 Epilepsy, unspecified, not intractable, without status epilepticus: Secondary | ICD-10-CM | POA: Diagnosis not present

## 2015-09-04 DIAGNOSIS — R2681 Unsteadiness on feet: Secondary | ICD-10-CM | POA: Diagnosis not present

## 2015-09-04 DIAGNOSIS — R4189 Other symptoms and signs involving cognitive functions and awareness: Secondary | ICD-10-CM | POA: Diagnosis not present

## 2015-09-04 DIAGNOSIS — G40209 Localization-related (focal) (partial) symptomatic epilepsy and epileptic syndromes with complex partial seizures, not intractable, without status epilepticus: Secondary | ICD-10-CM | POA: Diagnosis not present

## 2015-09-04 DIAGNOSIS — M6281 Muscle weakness (generalized): Secondary | ICD-10-CM | POA: Diagnosis not present

## 2015-09-06 DIAGNOSIS — L89629 Pressure ulcer of left heel, unspecified stage: Secondary | ICD-10-CM | POA: Diagnosis not present

## 2015-09-06 DIAGNOSIS — R2689 Other abnormalities of gait and mobility: Secondary | ICD-10-CM | POA: Diagnosis not present

## 2015-09-06 DIAGNOSIS — G40301 Generalized idiopathic epilepsy and epileptic syndromes, not intractable, with status epilepticus: Secondary | ICD-10-CM | POA: Diagnosis not present

## 2015-09-06 DIAGNOSIS — N390 Urinary tract infection, site not specified: Secondary | ICD-10-CM | POA: Diagnosis not present

## 2015-09-06 DIAGNOSIS — S83242A Other tear of medial meniscus, current injury, left knee, initial encounter: Secondary | ICD-10-CM | POA: Diagnosis not present

## 2015-09-06 DIAGNOSIS — S76112S Strain of left quadriceps muscle, fascia and tendon, sequela: Secondary | ICD-10-CM | POA: Diagnosis not present

## 2015-09-09 DIAGNOSIS — M25462 Effusion, left knee: Secondary | ICD-10-CM | POA: Diagnosis not present

## 2015-09-09 DIAGNOSIS — S76112A Strain of left quadriceps muscle, fascia and tendon, initial encounter: Secondary | ICD-10-CM | POA: Diagnosis not present

## 2015-09-10 DIAGNOSIS — R2689 Other abnormalities of gait and mobility: Secondary | ICD-10-CM | POA: Diagnosis not present

## 2015-09-10 DIAGNOSIS — G40209 Localization-related (focal) (partial) symptomatic epilepsy and epileptic syndromes with complex partial seizures, not intractable, without status epilepticus: Secondary | ICD-10-CM | POA: Diagnosis not present

## 2015-09-10 DIAGNOSIS — R269 Unspecified abnormalities of gait and mobility: Secondary | ICD-10-CM | POA: Diagnosis not present

## 2015-09-10 DIAGNOSIS — Z9181 History of falling: Secondary | ICD-10-CM | POA: Diagnosis not present

## 2015-09-10 DIAGNOSIS — E871 Hypo-osmolality and hyponatremia: Secondary | ICD-10-CM | POA: Diagnosis not present

## 2015-09-10 DIAGNOSIS — M6281 Muscle weakness (generalized): Secondary | ICD-10-CM | POA: Diagnosis not present

## 2015-09-10 DIAGNOSIS — K219 Gastro-esophageal reflux disease without esophagitis: Secondary | ICD-10-CM | POA: Diagnosis not present

## 2015-09-10 DIAGNOSIS — M25462 Effusion, left knee: Secondary | ICD-10-CM | POA: Diagnosis not present

## 2015-09-10 DIAGNOSIS — S76102D Unspecified injury of left quadriceps muscle, fascia and tendon, subsequent encounter: Secondary | ICD-10-CM | POA: Diagnosis not present

## 2015-09-10 DIAGNOSIS — M1990 Unspecified osteoarthritis, unspecified site: Secondary | ICD-10-CM | POA: Diagnosis not present

## 2015-09-10 DIAGNOSIS — G4733 Obstructive sleep apnea (adult) (pediatric): Secondary | ICD-10-CM | POA: Diagnosis not present

## 2015-09-10 DIAGNOSIS — G9340 Encephalopathy, unspecified: Secondary | ICD-10-CM | POA: Diagnosis not present

## 2015-09-11 DIAGNOSIS — Z951 Presence of aortocoronary bypass graft: Secondary | ICD-10-CM | POA: Diagnosis not present

## 2015-09-11 DIAGNOSIS — R260 Ataxic gait: Secondary | ICD-10-CM | POA: Diagnosis not present

## 2015-09-11 DIAGNOSIS — G40209 Localization-related (focal) (partial) symptomatic epilepsy and epileptic syndromes with complex partial seizures, not intractable, without status epilepticus: Secondary | ICD-10-CM | POA: Diagnosis not present

## 2015-09-11 DIAGNOSIS — Z96651 Presence of right artificial knee joint: Secondary | ICD-10-CM | POA: Diagnosis not present

## 2015-09-11 DIAGNOSIS — S76102D Unspecified injury of left quadriceps muscle, fascia and tendon, subsequent encounter: Secondary | ICD-10-CM | POA: Diagnosis not present

## 2015-09-11 DIAGNOSIS — G250 Essential tremor: Secondary | ICD-10-CM | POA: Diagnosis not present

## 2015-09-11 DIAGNOSIS — I25119 Atherosclerotic heart disease of native coronary artery with unspecified angina pectoris: Secondary | ICD-10-CM | POA: Diagnosis not present

## 2015-09-11 DIAGNOSIS — G4733 Obstructive sleep apnea (adult) (pediatric): Secondary | ICD-10-CM | POA: Diagnosis not present

## 2015-09-11 DIAGNOSIS — I10 Essential (primary) hypertension: Secondary | ICD-10-CM | POA: Diagnosis not present

## 2015-09-11 DIAGNOSIS — Z9181 History of falling: Secondary | ICD-10-CM | POA: Diagnosis not present

## 2015-09-14 DIAGNOSIS — Z951 Presence of aortocoronary bypass graft: Secondary | ICD-10-CM | POA: Diagnosis not present

## 2015-09-14 DIAGNOSIS — R260 Ataxic gait: Secondary | ICD-10-CM | POA: Diagnosis not present

## 2015-09-14 DIAGNOSIS — G40209 Localization-related (focal) (partial) symptomatic epilepsy and epileptic syndromes with complex partial seizures, not intractable, without status epilepticus: Secondary | ICD-10-CM | POA: Diagnosis not present

## 2015-09-14 DIAGNOSIS — Z9181 History of falling: Secondary | ICD-10-CM | POA: Diagnosis not present

## 2015-09-14 DIAGNOSIS — S76102D Unspecified injury of left quadriceps muscle, fascia and tendon, subsequent encounter: Secondary | ICD-10-CM | POA: Diagnosis not present

## 2015-09-14 DIAGNOSIS — Z96651 Presence of right artificial knee joint: Secondary | ICD-10-CM | POA: Diagnosis not present

## 2015-09-14 DIAGNOSIS — I10 Essential (primary) hypertension: Secondary | ICD-10-CM | POA: Diagnosis not present

## 2015-09-14 DIAGNOSIS — I25119 Atherosclerotic heart disease of native coronary artery with unspecified angina pectoris: Secondary | ICD-10-CM | POA: Diagnosis not present

## 2015-09-14 DIAGNOSIS — G250 Essential tremor: Secondary | ICD-10-CM | POA: Diagnosis not present

## 2015-09-14 DIAGNOSIS — G4733 Obstructive sleep apnea (adult) (pediatric): Secondary | ICD-10-CM | POA: Diagnosis not present

## 2015-09-15 DIAGNOSIS — S76112D Strain of left quadriceps muscle, fascia and tendon, subsequent encounter: Secondary | ICD-10-CM | POA: Diagnosis not present

## 2015-09-18 DIAGNOSIS — R351 Nocturia: Secondary | ICD-10-CM | POA: Diagnosis not present

## 2015-09-18 DIAGNOSIS — N400 Enlarged prostate without lower urinary tract symptoms: Secondary | ICD-10-CM | POA: Diagnosis not present

## 2015-09-18 DIAGNOSIS — Z23 Encounter for immunization: Secondary | ICD-10-CM | POA: Diagnosis not present

## 2015-09-23 DIAGNOSIS — T162XXA Foreign body in left ear, initial encounter: Secondary | ICD-10-CM | POA: Diagnosis not present

## 2015-09-28 DIAGNOSIS — M25551 Pain in right hip: Secondary | ICD-10-CM | POA: Diagnosis not present

## 2015-09-28 DIAGNOSIS — M5136 Other intervertebral disc degeneration, lumbar region: Secondary | ICD-10-CM | POA: Diagnosis not present

## 2015-09-28 DIAGNOSIS — M791 Myalgia: Secondary | ICD-10-CM | POA: Diagnosis not present

## 2015-09-28 DIAGNOSIS — M545 Low back pain: Secondary | ICD-10-CM | POA: Diagnosis not present

## 2015-10-01 DIAGNOSIS — N400 Enlarged prostate without lower urinary tract symptoms: Secondary | ICD-10-CM | POA: Diagnosis not present

## 2015-10-01 DIAGNOSIS — E785 Hyperlipidemia, unspecified: Secondary | ICD-10-CM | POA: Diagnosis not present

## 2015-10-01 DIAGNOSIS — Z9989 Dependence on other enabling machines and devices: Secondary | ICD-10-CM | POA: Diagnosis not present

## 2015-10-01 DIAGNOSIS — I10 Essential (primary) hypertension: Secondary | ICD-10-CM | POA: Diagnosis not present

## 2015-10-01 DIAGNOSIS — Z0000 Encounter for general adult medical examination without abnormal findings: Secondary | ICD-10-CM | POA: Diagnosis not present

## 2015-10-01 DIAGNOSIS — G40209 Localization-related (focal) (partial) symptomatic epilepsy and epileptic syndromes with complex partial seizures, not intractable, without status epilepticus: Secondary | ICD-10-CM | POA: Diagnosis not present

## 2015-10-01 DIAGNOSIS — G4733 Obstructive sleep apnea (adult) (pediatric): Secondary | ICD-10-CM | POA: Diagnosis not present

## 2015-10-05 DIAGNOSIS — S76102D Unspecified injury of left quadriceps muscle, fascia and tendon, subsequent encounter: Secondary | ICD-10-CM | POA: Diagnosis not present

## 2015-10-05 DIAGNOSIS — Z951 Presence of aortocoronary bypass graft: Secondary | ICD-10-CM | POA: Diagnosis not present

## 2015-10-05 DIAGNOSIS — R260 Ataxic gait: Secondary | ICD-10-CM | POA: Diagnosis not present

## 2015-10-05 DIAGNOSIS — I25119 Atherosclerotic heart disease of native coronary artery with unspecified angina pectoris: Secondary | ICD-10-CM | POA: Diagnosis not present

## 2015-10-05 DIAGNOSIS — G250 Essential tremor: Secondary | ICD-10-CM | POA: Diagnosis not present

## 2015-10-05 DIAGNOSIS — G40209 Localization-related (focal) (partial) symptomatic epilepsy and epileptic syndromes with complex partial seizures, not intractable, without status epilepticus: Secondary | ICD-10-CM | POA: Diagnosis not present

## 2015-10-05 DIAGNOSIS — Z9181 History of falling: Secondary | ICD-10-CM | POA: Diagnosis not present

## 2015-10-05 DIAGNOSIS — G4733 Obstructive sleep apnea (adult) (pediatric): Secondary | ICD-10-CM | POA: Diagnosis not present

## 2015-10-05 DIAGNOSIS — I10 Essential (primary) hypertension: Secondary | ICD-10-CM | POA: Diagnosis not present

## 2015-10-05 DIAGNOSIS — Z96651 Presence of right artificial knee joint: Secondary | ICD-10-CM | POA: Diagnosis not present

## 2015-10-08 DIAGNOSIS — S76112D Strain of left quadriceps muscle, fascia and tendon, subsequent encounter: Secondary | ICD-10-CM | POA: Diagnosis not present

## 2015-10-10 DIAGNOSIS — R2689 Other abnormalities of gait and mobility: Secondary | ICD-10-CM | POA: Diagnosis not present

## 2015-10-10 DIAGNOSIS — R269 Unspecified abnormalities of gait and mobility: Secondary | ICD-10-CM | POA: Diagnosis not present

## 2015-10-10 DIAGNOSIS — M25462 Effusion, left knee: Secondary | ICD-10-CM | POA: Diagnosis not present

## 2015-10-10 DIAGNOSIS — G4733 Obstructive sleep apnea (adult) (pediatric): Secondary | ICD-10-CM | POA: Diagnosis not present

## 2015-10-10 DIAGNOSIS — G9340 Encephalopathy, unspecified: Secondary | ICD-10-CM | POA: Diagnosis not present

## 2015-10-10 DIAGNOSIS — E871 Hypo-osmolality and hyponatremia: Secondary | ICD-10-CM | POA: Diagnosis not present

## 2015-10-10 DIAGNOSIS — G40209 Localization-related (focal) (partial) symptomatic epilepsy and epileptic syndromes with complex partial seizures, not intractable, without status epilepticus: Secondary | ICD-10-CM | POA: Diagnosis not present

## 2015-10-10 DIAGNOSIS — K219 Gastro-esophageal reflux disease without esophagitis: Secondary | ICD-10-CM | POA: Diagnosis not present

## 2015-10-10 DIAGNOSIS — M1990 Unspecified osteoarthritis, unspecified site: Secondary | ICD-10-CM | POA: Diagnosis not present

## 2015-10-10 DIAGNOSIS — Z9181 History of falling: Secondary | ICD-10-CM | POA: Diagnosis not present

## 2015-10-10 DIAGNOSIS — M6281 Muscle weakness (generalized): Secondary | ICD-10-CM | POA: Diagnosis not present

## 2015-10-10 DIAGNOSIS — S76102D Unspecified injury of left quadriceps muscle, fascia and tendon, subsequent encounter: Secondary | ICD-10-CM | POA: Diagnosis not present

## 2015-10-13 DIAGNOSIS — I25119 Atherosclerotic heart disease of native coronary artery with unspecified angina pectoris: Secondary | ICD-10-CM | POA: Diagnosis not present

## 2015-10-13 DIAGNOSIS — I10 Essential (primary) hypertension: Secondary | ICD-10-CM | POA: Diagnosis not present

## 2015-10-13 DIAGNOSIS — S76102D Unspecified injury of left quadriceps muscle, fascia and tendon, subsequent encounter: Secondary | ICD-10-CM | POA: Diagnosis not present

## 2015-10-13 DIAGNOSIS — G40209 Localization-related (focal) (partial) symptomatic epilepsy and epileptic syndromes with complex partial seizures, not intractable, without status epilepticus: Secondary | ICD-10-CM | POA: Diagnosis not present

## 2015-10-13 DIAGNOSIS — G4733 Obstructive sleep apnea (adult) (pediatric): Secondary | ICD-10-CM | POA: Diagnosis not present

## 2015-10-13 DIAGNOSIS — G250 Essential tremor: Secondary | ICD-10-CM | POA: Diagnosis not present

## 2015-10-13 DIAGNOSIS — Z951 Presence of aortocoronary bypass graft: Secondary | ICD-10-CM | POA: Diagnosis not present

## 2015-10-13 DIAGNOSIS — Z9181 History of falling: Secondary | ICD-10-CM | POA: Diagnosis not present

## 2015-10-13 DIAGNOSIS — Z96651 Presence of right artificial knee joint: Secondary | ICD-10-CM | POA: Diagnosis not present

## 2015-10-13 DIAGNOSIS — R260 Ataxic gait: Secondary | ICD-10-CM | POA: Diagnosis not present

## 2015-11-02 DIAGNOSIS — N400 Enlarged prostate without lower urinary tract symptoms: Secondary | ICD-10-CM | POA: Diagnosis not present

## 2015-11-06 DIAGNOSIS — G4733 Obstructive sleep apnea (adult) (pediatric): Secondary | ICD-10-CM | POA: Diagnosis not present

## 2015-11-06 DIAGNOSIS — Z96651 Presence of right artificial knee joint: Secondary | ICD-10-CM | POA: Diagnosis not present

## 2015-11-06 DIAGNOSIS — G40209 Localization-related (focal) (partial) symptomatic epilepsy and epileptic syndromes with complex partial seizures, not intractable, without status epilepticus: Secondary | ICD-10-CM | POA: Diagnosis not present

## 2015-11-06 DIAGNOSIS — Z951 Presence of aortocoronary bypass graft: Secondary | ICD-10-CM | POA: Diagnosis not present

## 2015-11-06 DIAGNOSIS — S76102D Unspecified injury of left quadriceps muscle, fascia and tendon, subsequent encounter: Secondary | ICD-10-CM | POA: Diagnosis not present

## 2015-11-06 DIAGNOSIS — R260 Ataxic gait: Secondary | ICD-10-CM | POA: Diagnosis not present

## 2015-11-06 DIAGNOSIS — G250 Essential tremor: Secondary | ICD-10-CM | POA: Diagnosis not present

## 2015-11-06 DIAGNOSIS — Z9181 History of falling: Secondary | ICD-10-CM | POA: Diagnosis not present

## 2015-11-06 DIAGNOSIS — I10 Essential (primary) hypertension: Secondary | ICD-10-CM | POA: Diagnosis not present

## 2015-11-06 DIAGNOSIS — I25119 Atherosclerotic heart disease of native coronary artery with unspecified angina pectoris: Secondary | ICD-10-CM | POA: Diagnosis not present

## 2015-11-10 DIAGNOSIS — M6281 Muscle weakness (generalized): Secondary | ICD-10-CM | POA: Diagnosis not present

## 2015-11-10 DIAGNOSIS — R269 Unspecified abnormalities of gait and mobility: Secondary | ICD-10-CM | POA: Diagnosis not present

## 2015-11-10 DIAGNOSIS — M25462 Effusion, left knee: Secondary | ICD-10-CM | POA: Diagnosis not present

## 2015-11-10 DIAGNOSIS — M1990 Unspecified osteoarthritis, unspecified site: Secondary | ICD-10-CM | POA: Diagnosis not present

## 2015-11-10 DIAGNOSIS — S76102D Unspecified injury of left quadriceps muscle, fascia and tendon, subsequent encounter: Secondary | ICD-10-CM | POA: Diagnosis not present

## 2015-11-10 DIAGNOSIS — K219 Gastro-esophageal reflux disease without esophagitis: Secondary | ICD-10-CM | POA: Diagnosis not present

## 2015-11-10 DIAGNOSIS — R2689 Other abnormalities of gait and mobility: Secondary | ICD-10-CM | POA: Diagnosis not present

## 2015-11-10 DIAGNOSIS — G9340 Encephalopathy, unspecified: Secondary | ICD-10-CM | POA: Diagnosis not present

## 2015-11-10 DIAGNOSIS — G40209 Localization-related (focal) (partial) symptomatic epilepsy and epileptic syndromes with complex partial seizures, not intractable, without status epilepticus: Secondary | ICD-10-CM | POA: Diagnosis not present

## 2015-11-10 DIAGNOSIS — G4733 Obstructive sleep apnea (adult) (pediatric): Secondary | ICD-10-CM | POA: Diagnosis not present

## 2015-11-10 DIAGNOSIS — Z9181 History of falling: Secondary | ICD-10-CM | POA: Diagnosis not present

## 2015-11-10 DIAGNOSIS — E871 Hypo-osmolality and hyponatremia: Secondary | ICD-10-CM | POA: Diagnosis not present

## 2015-11-24 DIAGNOSIS — M1712 Unilateral primary osteoarthritis, left knee: Secondary | ICD-10-CM | POA: Diagnosis not present

## 2015-11-24 DIAGNOSIS — S76112A Strain of left quadriceps muscle, fascia and tendon, initial encounter: Secondary | ICD-10-CM | POA: Diagnosis not present

## 2015-12-01 DIAGNOSIS — R569 Unspecified convulsions: Secondary | ICD-10-CM | POA: Diagnosis not present

## 2015-12-10 DIAGNOSIS — E871 Hypo-osmolality and hyponatremia: Secondary | ICD-10-CM | POA: Diagnosis not present

## 2015-12-10 DIAGNOSIS — Z0000 Encounter for general adult medical examination without abnormal findings: Secondary | ICD-10-CM | POA: Diagnosis not present

## 2015-12-10 DIAGNOSIS — R2689 Other abnormalities of gait and mobility: Secondary | ICD-10-CM | POA: Diagnosis not present

## 2015-12-10 DIAGNOSIS — Z9181 History of falling: Secondary | ICD-10-CM | POA: Diagnosis not present

## 2015-12-10 DIAGNOSIS — M6281 Muscle weakness (generalized): Secondary | ICD-10-CM | POA: Diagnosis not present

## 2015-12-10 DIAGNOSIS — M1990 Unspecified osteoarthritis, unspecified site: Secondary | ICD-10-CM | POA: Diagnosis not present

## 2015-12-10 DIAGNOSIS — M25462 Effusion, left knee: Secondary | ICD-10-CM | POA: Diagnosis not present

## 2015-12-10 DIAGNOSIS — G4733 Obstructive sleep apnea (adult) (pediatric): Secondary | ICD-10-CM | POA: Diagnosis not present

## 2015-12-10 DIAGNOSIS — K219 Gastro-esophageal reflux disease without esophagitis: Secondary | ICD-10-CM | POA: Diagnosis not present

## 2015-12-10 DIAGNOSIS — G9340 Encephalopathy, unspecified: Secondary | ICD-10-CM | POA: Diagnosis not present

## 2015-12-10 DIAGNOSIS — S76102D Unspecified injury of left quadriceps muscle, fascia and tendon, subsequent encounter: Secondary | ICD-10-CM | POA: Diagnosis not present

## 2015-12-10 DIAGNOSIS — R269 Unspecified abnormalities of gait and mobility: Secondary | ICD-10-CM | POA: Diagnosis not present

## 2015-12-10 DIAGNOSIS — G40209 Localization-related (focal) (partial) symptomatic epilepsy and epileptic syndromes with complex partial seizures, not intractable, without status epilepticus: Secondary | ICD-10-CM | POA: Diagnosis not present

## 2015-12-23 DIAGNOSIS — R410 Disorientation, unspecified: Secondary | ICD-10-CM | POA: Diagnosis not present

## 2015-12-23 DIAGNOSIS — Z7982 Long term (current) use of aspirin: Secondary | ICD-10-CM | POA: Diagnosis not present

## 2015-12-23 DIAGNOSIS — I252 Old myocardial infarction: Secondary | ICD-10-CM | POA: Diagnosis not present

## 2015-12-23 DIAGNOSIS — Z85828 Personal history of other malignant neoplasm of skin: Secondary | ICD-10-CM | POA: Diagnosis not present

## 2015-12-23 DIAGNOSIS — Z8571 Personal history of Hodgkin lymphoma: Secondary | ICD-10-CM | POA: Diagnosis not present

## 2015-12-23 DIAGNOSIS — R4182 Altered mental status, unspecified: Secondary | ICD-10-CM | POA: Diagnosis not present

## 2015-12-23 DIAGNOSIS — Z955 Presence of coronary angioplasty implant and graft: Secondary | ICD-10-CM | POA: Diagnosis not present

## 2015-12-23 DIAGNOSIS — I6781 Acute cerebrovascular insufficiency: Secondary | ICD-10-CM | POA: Diagnosis not present

## 2015-12-23 DIAGNOSIS — Z96651 Presence of right artificial knee joint: Secondary | ICD-10-CM | POA: Diagnosis not present

## 2015-12-23 DIAGNOSIS — G40209 Localization-related (focal) (partial) symptomatic epilepsy and epileptic syndromes with complex partial seizures, not intractable, without status epilepticus: Secondary | ICD-10-CM | POA: Diagnosis not present

## 2015-12-23 DIAGNOSIS — G9340 Encephalopathy, unspecified: Secondary | ICD-10-CM | POA: Diagnosis not present

## 2015-12-23 DIAGNOSIS — I10 Essential (primary) hypertension: Secondary | ICD-10-CM | POA: Diagnosis not present

## 2015-12-23 DIAGNOSIS — Z87891 Personal history of nicotine dependence: Secondary | ICD-10-CM | POA: Diagnosis not present

## 2015-12-23 DIAGNOSIS — K219 Gastro-esophageal reflux disease without esophagitis: Secondary | ICD-10-CM | POA: Diagnosis not present

## 2015-12-23 DIAGNOSIS — R251 Tremor, unspecified: Secondary | ICD-10-CM | POA: Diagnosis not present

## 2015-12-23 DIAGNOSIS — R569 Unspecified convulsions: Secondary | ICD-10-CM | POA: Diagnosis not present

## 2015-12-23 DIAGNOSIS — Z79899 Other long term (current) drug therapy: Secondary | ICD-10-CM | POA: Diagnosis not present

## 2015-12-23 DIAGNOSIS — Z885 Allergy status to narcotic agent status: Secondary | ICD-10-CM | POA: Diagnosis not present

## 2015-12-23 DIAGNOSIS — I2510 Atherosclerotic heart disease of native coronary artery without angina pectoris: Secondary | ICD-10-CM | POA: Diagnosis not present

## 2015-12-23 DIAGNOSIS — E785 Hyperlipidemia, unspecified: Secondary | ICD-10-CM | POA: Diagnosis not present

## 2015-12-23 DIAGNOSIS — I452 Bifascicular block: Secondary | ICD-10-CM | POA: Diagnosis not present

## 2015-12-23 DIAGNOSIS — Z8673 Personal history of transient ischemic attack (TIA), and cerebral infarction without residual deficits: Secondary | ICD-10-CM | POA: Diagnosis not present

## 2015-12-23 DIAGNOSIS — N400 Enlarged prostate without lower urinary tract symptoms: Secondary | ICD-10-CM | POA: Diagnosis not present

## 2015-12-23 DIAGNOSIS — Z888 Allergy status to other drugs, medicaments and biological substances status: Secondary | ICD-10-CM | POA: Diagnosis not present

## 2016-01-05 DIAGNOSIS — M1712 Unilateral primary osteoarthritis, left knee: Secondary | ICD-10-CM | POA: Diagnosis not present

## 2016-01-10 DIAGNOSIS — G40209 Localization-related (focal) (partial) symptomatic epilepsy and epileptic syndromes with complex partial seizures, not intractable, without status epilepticus: Secondary | ICD-10-CM | POA: Diagnosis not present

## 2016-01-10 DIAGNOSIS — R269 Unspecified abnormalities of gait and mobility: Secondary | ICD-10-CM | POA: Diagnosis not present

## 2016-01-10 DIAGNOSIS — R2689 Other abnormalities of gait and mobility: Secondary | ICD-10-CM | POA: Diagnosis not present

## 2016-01-10 DIAGNOSIS — G9340 Encephalopathy, unspecified: Secondary | ICD-10-CM | POA: Diagnosis not present

## 2016-01-10 DIAGNOSIS — K219 Gastro-esophageal reflux disease without esophagitis: Secondary | ICD-10-CM | POA: Diagnosis not present

## 2016-01-10 DIAGNOSIS — M1990 Unspecified osteoarthritis, unspecified site: Secondary | ICD-10-CM | POA: Diagnosis not present

## 2016-01-10 DIAGNOSIS — E871 Hypo-osmolality and hyponatremia: Secondary | ICD-10-CM | POA: Diagnosis not present

## 2016-01-10 DIAGNOSIS — S76102D Unspecified injury of left quadriceps muscle, fascia and tendon, subsequent encounter: Secondary | ICD-10-CM | POA: Diagnosis not present

## 2016-01-10 DIAGNOSIS — M25462 Effusion, left knee: Secondary | ICD-10-CM | POA: Diagnosis not present

## 2016-01-10 DIAGNOSIS — Z9181 History of falling: Secondary | ICD-10-CM | POA: Diagnosis not present

## 2016-01-10 DIAGNOSIS — M6281 Muscle weakness (generalized): Secondary | ICD-10-CM | POA: Diagnosis not present

## 2016-01-10 DIAGNOSIS — G4733 Obstructive sleep apnea (adult) (pediatric): Secondary | ICD-10-CM | POA: Diagnosis not present

## 2016-01-11 DIAGNOSIS — M1712 Unilateral primary osteoarthritis, left knee: Secondary | ICD-10-CM | POA: Diagnosis not present

## 2016-01-11 DIAGNOSIS — I2510 Atherosclerotic heart disease of native coronary artery without angina pectoris: Secondary | ICD-10-CM | POA: Diagnosis not present

## 2016-01-11 DIAGNOSIS — Z951 Presence of aortocoronary bypass graft: Secondary | ICD-10-CM | POA: Diagnosis not present

## 2016-01-11 DIAGNOSIS — Z87891 Personal history of nicotine dependence: Secondary | ICD-10-CM | POA: Diagnosis not present

## 2016-01-11 DIAGNOSIS — I10 Essential (primary) hypertension: Secondary | ICD-10-CM | POA: Diagnosis not present

## 2016-01-11 DIAGNOSIS — Z7902 Long term (current) use of antithrombotics/antiplatelets: Secondary | ICD-10-CM | POA: Diagnosis not present

## 2016-01-11 DIAGNOSIS — Z8673 Personal history of transient ischemic attack (TIA), and cerebral infarction without residual deficits: Secondary | ICD-10-CM | POA: Diagnosis not present

## 2016-01-11 DIAGNOSIS — G40209 Localization-related (focal) (partial) symptomatic epilepsy and epileptic syndromes with complex partial seizures, not intractable, without status epilepticus: Secondary | ICD-10-CM | POA: Diagnosis not present

## 2016-01-11 DIAGNOSIS — R2689 Other abnormalities of gait and mobility: Secondary | ICD-10-CM | POA: Diagnosis not present

## 2016-01-13 DIAGNOSIS — Z951 Presence of aortocoronary bypass graft: Secondary | ICD-10-CM | POA: Diagnosis not present

## 2016-01-13 DIAGNOSIS — I10 Essential (primary) hypertension: Secondary | ICD-10-CM | POA: Diagnosis not present

## 2016-01-13 DIAGNOSIS — Z8673 Personal history of transient ischemic attack (TIA), and cerebral infarction without residual deficits: Secondary | ICD-10-CM | POA: Diagnosis not present

## 2016-01-13 DIAGNOSIS — M1712 Unilateral primary osteoarthritis, left knee: Secondary | ICD-10-CM | POA: Diagnosis not present

## 2016-01-13 DIAGNOSIS — I2510 Atherosclerotic heart disease of native coronary artery without angina pectoris: Secondary | ICD-10-CM | POA: Diagnosis not present

## 2016-01-13 DIAGNOSIS — G40209 Localization-related (focal) (partial) symptomatic epilepsy and epileptic syndromes with complex partial seizures, not intractable, without status epilepticus: Secondary | ICD-10-CM | POA: Diagnosis not present

## 2016-01-13 DIAGNOSIS — R2689 Other abnormalities of gait and mobility: Secondary | ICD-10-CM | POA: Diagnosis not present

## 2016-01-13 DIAGNOSIS — Z87891 Personal history of nicotine dependence: Secondary | ICD-10-CM | POA: Diagnosis not present

## 2016-01-13 DIAGNOSIS — Z7902 Long term (current) use of antithrombotics/antiplatelets: Secondary | ICD-10-CM | POA: Diagnosis not present

## 2016-01-14 DIAGNOSIS — R251 Tremor, unspecified: Secondary | ICD-10-CM | POA: Diagnosis not present

## 2016-01-14 DIAGNOSIS — G40909 Epilepsy, unspecified, not intractable, without status epilepticus: Secondary | ICD-10-CM | POA: Diagnosis not present

## 2016-02-04 DIAGNOSIS — Z8673 Personal history of transient ischemic attack (TIA), and cerebral infarction without residual deficits: Secondary | ICD-10-CM | POA: Diagnosis not present

## 2016-02-04 DIAGNOSIS — G40209 Localization-related (focal) (partial) symptomatic epilepsy and epileptic syndromes with complex partial seizures, not intractable, without status epilepticus: Secondary | ICD-10-CM | POA: Diagnosis not present

## 2016-02-04 DIAGNOSIS — Z87891 Personal history of nicotine dependence: Secondary | ICD-10-CM | POA: Diagnosis not present

## 2016-02-04 DIAGNOSIS — Z7902 Long term (current) use of antithrombotics/antiplatelets: Secondary | ICD-10-CM | POA: Diagnosis not present

## 2016-02-04 DIAGNOSIS — Z951 Presence of aortocoronary bypass graft: Secondary | ICD-10-CM | POA: Diagnosis not present

## 2016-02-04 DIAGNOSIS — R2689 Other abnormalities of gait and mobility: Secondary | ICD-10-CM | POA: Diagnosis not present

## 2016-02-04 DIAGNOSIS — I10 Essential (primary) hypertension: Secondary | ICD-10-CM | POA: Diagnosis not present

## 2016-02-04 DIAGNOSIS — M1712 Unilateral primary osteoarthritis, left knee: Secondary | ICD-10-CM | POA: Diagnosis not present

## 2016-02-04 DIAGNOSIS — I2510 Atherosclerotic heart disease of native coronary artery without angina pectoris: Secondary | ICD-10-CM | POA: Diagnosis not present

## 2016-02-10 DIAGNOSIS — G9340 Encephalopathy, unspecified: Secondary | ICD-10-CM | POA: Diagnosis not present

## 2016-02-10 DIAGNOSIS — E871 Hypo-osmolality and hyponatremia: Secondary | ICD-10-CM | POA: Diagnosis not present

## 2016-02-10 DIAGNOSIS — Z9181 History of falling: Secondary | ICD-10-CM | POA: Diagnosis not present

## 2016-02-10 DIAGNOSIS — M1990 Unspecified osteoarthritis, unspecified site: Secondary | ICD-10-CM | POA: Diagnosis not present

## 2016-02-10 DIAGNOSIS — S76102D Unspecified injury of left quadriceps muscle, fascia and tendon, subsequent encounter: Secondary | ICD-10-CM | POA: Diagnosis not present

## 2016-02-10 DIAGNOSIS — M6281 Muscle weakness (generalized): Secondary | ICD-10-CM | POA: Diagnosis not present

## 2016-02-10 DIAGNOSIS — G4733 Obstructive sleep apnea (adult) (pediatric): Secondary | ICD-10-CM | POA: Diagnosis not present

## 2016-02-10 DIAGNOSIS — M25462 Effusion, left knee: Secondary | ICD-10-CM | POA: Diagnosis not present

## 2016-02-10 DIAGNOSIS — G40209 Localization-related (focal) (partial) symptomatic epilepsy and epileptic syndromes with complex partial seizures, not intractable, without status epilepticus: Secondary | ICD-10-CM | POA: Diagnosis not present

## 2016-02-10 DIAGNOSIS — K219 Gastro-esophageal reflux disease without esophagitis: Secondary | ICD-10-CM | POA: Diagnosis not present

## 2016-02-10 DIAGNOSIS — R2689 Other abnormalities of gait and mobility: Secondary | ICD-10-CM | POA: Diagnosis not present

## 2016-02-10 DIAGNOSIS — R269 Unspecified abnormalities of gait and mobility: Secondary | ICD-10-CM | POA: Diagnosis not present

## 2016-02-17 DIAGNOSIS — D225 Melanocytic nevi of trunk: Secondary | ICD-10-CM | POA: Diagnosis not present

## 2016-02-17 DIAGNOSIS — Z85828 Personal history of other malignant neoplasm of skin: Secondary | ICD-10-CM | POA: Diagnosis not present

## 2016-02-17 DIAGNOSIS — L570 Actinic keratosis: Secondary | ICD-10-CM | POA: Diagnosis not present

## 2016-02-17 DIAGNOSIS — L821 Other seborrheic keratosis: Secondary | ICD-10-CM | POA: Diagnosis not present

## 2016-02-17 DIAGNOSIS — D1801 Hemangioma of skin and subcutaneous tissue: Secondary | ICD-10-CM | POA: Diagnosis not present

## 2016-02-23 DIAGNOSIS — E871 Hypo-osmolality and hyponatremia: Secondary | ICD-10-CM | POA: Diagnosis not present

## 2016-02-23 DIAGNOSIS — G629 Polyneuropathy, unspecified: Secondary | ICD-10-CM | POA: Diagnosis not present

## 2016-02-23 DIAGNOSIS — M1712 Unilateral primary osteoarthritis, left knee: Secondary | ICD-10-CM | POA: Diagnosis not present

## 2016-02-23 DIAGNOSIS — R600 Localized edema: Secondary | ICD-10-CM | POA: Diagnosis not present

## 2016-02-23 DIAGNOSIS — Z9861 Coronary angioplasty status: Secondary | ICD-10-CM | POA: Diagnosis not present

## 2016-02-23 DIAGNOSIS — I350 Nonrheumatic aortic (valve) stenosis: Secondary | ICD-10-CM | POA: Diagnosis not present

## 2016-02-23 DIAGNOSIS — I2510 Atherosclerotic heart disease of native coronary artery without angina pectoris: Secondary | ICD-10-CM | POA: Diagnosis not present

## 2016-02-24 DIAGNOSIS — I350 Nonrheumatic aortic (valve) stenosis: Secondary | ICD-10-CM | POA: Diagnosis not present

## 2016-02-24 DIAGNOSIS — I340 Nonrheumatic mitral (valve) insufficiency: Secondary | ICD-10-CM | POA: Diagnosis not present

## 2016-02-24 DIAGNOSIS — I10 Essential (primary) hypertension: Secondary | ICD-10-CM | POA: Diagnosis not present

## 2016-02-24 DIAGNOSIS — I2510 Atherosclerotic heart disease of native coronary artery without angina pectoris: Secondary | ICD-10-CM | POA: Diagnosis not present

## 2016-02-24 DIAGNOSIS — Z9861 Coronary angioplasty status: Secondary | ICD-10-CM | POA: Diagnosis not present

## 2016-03-01 DIAGNOSIS — M1712 Unilateral primary osteoarthritis, left knee: Secondary | ICD-10-CM | POA: Diagnosis not present

## 2016-03-02 DIAGNOSIS — G4733 Obstructive sleep apnea (adult) (pediatric): Secondary | ICD-10-CM | POA: Diagnosis not present

## 2016-03-02 DIAGNOSIS — Z885 Allergy status to narcotic agent status: Secondary | ICD-10-CM | POA: Diagnosis not present

## 2016-03-02 DIAGNOSIS — Z87891 Personal history of nicotine dependence: Secondary | ICD-10-CM | POA: Diagnosis not present

## 2016-03-02 DIAGNOSIS — G40909 Epilepsy, unspecified, not intractable, without status epilepticus: Secondary | ICD-10-CM | POA: Diagnosis not present

## 2016-03-02 DIAGNOSIS — Z79899 Other long term (current) drug therapy: Secondary | ICD-10-CM | POA: Diagnosis not present

## 2016-03-02 DIAGNOSIS — Z7982 Long term (current) use of aspirin: Secondary | ICD-10-CM | POA: Diagnosis not present

## 2016-03-02 DIAGNOSIS — C8190 Hodgkin lymphoma, unspecified, unspecified site: Secondary | ICD-10-CM | POA: Diagnosis not present

## 2016-03-02 DIAGNOSIS — I10 Essential (primary) hypertension: Secondary | ICD-10-CM | POA: Diagnosis not present

## 2016-03-02 DIAGNOSIS — E785 Hyperlipidemia, unspecified: Secondary | ICD-10-CM | POA: Diagnosis not present

## 2016-03-02 DIAGNOSIS — Z7951 Long term (current) use of inhaled steroids: Secondary | ICD-10-CM | POA: Diagnosis not present

## 2016-03-02 DIAGNOSIS — I2510 Atherosclerotic heart disease of native coronary artery without angina pectoris: Secondary | ICD-10-CM | POA: Diagnosis not present

## 2016-03-02 DIAGNOSIS — Z888 Allergy status to other drugs, medicaments and biological substances status: Secondary | ICD-10-CM | POA: Diagnosis not present

## 2016-03-02 DIAGNOSIS — Z955 Presence of coronary angioplasty implant and graft: Secondary | ICD-10-CM | POA: Diagnosis not present

## 2016-03-02 DIAGNOSIS — I200 Unstable angina: Secondary | ICD-10-CM | POA: Diagnosis not present

## 2016-03-02 DIAGNOSIS — I25110 Atherosclerotic heart disease of native coronary artery with unstable angina pectoris: Secondary | ICD-10-CM | POA: Diagnosis not present

## 2016-03-02 DIAGNOSIS — I252 Old myocardial infarction: Secondary | ICD-10-CM | POA: Diagnosis not present

## 2016-03-02 DIAGNOSIS — I350 Nonrheumatic aortic (valve) stenosis: Secondary | ICD-10-CM | POA: Diagnosis not present

## 2016-03-02 DIAGNOSIS — K219 Gastro-esophageal reflux disease without esophagitis: Secondary | ICD-10-CM | POA: Diagnosis not present

## 2016-03-02 DIAGNOSIS — R079 Chest pain, unspecified: Secondary | ICD-10-CM | POA: Diagnosis not present

## 2016-03-02 DIAGNOSIS — Z8673 Personal history of transient ischemic attack (TIA), and cerebral infarction without residual deficits: Secondary | ICD-10-CM | POA: Diagnosis not present

## 2016-03-03 DIAGNOSIS — I214 Non-ST elevation (NSTEMI) myocardial infarction: Secondary | ICD-10-CM | POA: Diagnosis not present

## 2016-03-03 DIAGNOSIS — E785 Hyperlipidemia, unspecified: Secondary | ICD-10-CM | POA: Diagnosis not present

## 2016-03-03 DIAGNOSIS — I10 Essential (primary) hypertension: Secondary | ICD-10-CM | POA: Diagnosis not present

## 2016-03-03 DIAGNOSIS — Z955 Presence of coronary angioplasty implant and graft: Secondary | ICD-10-CM | POA: Diagnosis not present

## 2016-03-03 DIAGNOSIS — I25110 Atherosclerotic heart disease of native coronary artery with unstable angina pectoris: Secondary | ICD-10-CM | POA: Diagnosis not present

## 2016-03-03 DIAGNOSIS — G40909 Epilepsy, unspecified, not intractable, without status epilepticus: Secondary | ICD-10-CM | POA: Diagnosis not present

## 2016-03-03 DIAGNOSIS — T82855A Stenosis of coronary artery stent, initial encounter: Secondary | ICD-10-CM | POA: Diagnosis not present

## 2016-03-03 DIAGNOSIS — I350 Nonrheumatic aortic (valve) stenosis: Secondary | ICD-10-CM | POA: Diagnosis not present

## 2016-03-04 DIAGNOSIS — I10 Essential (primary) hypertension: Secondary | ICD-10-CM | POA: Diagnosis not present

## 2016-03-04 DIAGNOSIS — E785 Hyperlipidemia, unspecified: Secondary | ICD-10-CM | POA: Diagnosis not present

## 2016-03-04 DIAGNOSIS — I25110 Atherosclerotic heart disease of native coronary artery with unstable angina pectoris: Secondary | ICD-10-CM | POA: Diagnosis not present

## 2016-03-04 DIAGNOSIS — Z955 Presence of coronary angioplasty implant and graft: Secondary | ICD-10-CM | POA: Diagnosis not present

## 2016-03-07 DIAGNOSIS — M1712 Unilateral primary osteoarthritis, left knee: Secondary | ICD-10-CM | POA: Diagnosis not present

## 2016-03-09 DIAGNOSIS — G4733 Obstructive sleep apnea (adult) (pediatric): Secondary | ICD-10-CM | POA: Diagnosis not present

## 2016-03-09 DIAGNOSIS — Z9181 History of falling: Secondary | ICD-10-CM | POA: Diagnosis not present

## 2016-03-09 DIAGNOSIS — R269 Unspecified abnormalities of gait and mobility: Secondary | ICD-10-CM | POA: Diagnosis not present

## 2016-03-09 DIAGNOSIS — R2689 Other abnormalities of gait and mobility: Secondary | ICD-10-CM | POA: Diagnosis not present

## 2016-03-09 DIAGNOSIS — E871 Hypo-osmolality and hyponatremia: Secondary | ICD-10-CM | POA: Diagnosis not present

## 2016-03-09 DIAGNOSIS — S76102D Unspecified injury of left quadriceps muscle, fascia and tendon, subsequent encounter: Secondary | ICD-10-CM | POA: Diagnosis not present

## 2016-03-09 DIAGNOSIS — G40209 Localization-related (focal) (partial) symptomatic epilepsy and epileptic syndromes with complex partial seizures, not intractable, without status epilepticus: Secondary | ICD-10-CM | POA: Diagnosis not present

## 2016-03-09 DIAGNOSIS — G9340 Encephalopathy, unspecified: Secondary | ICD-10-CM | POA: Diagnosis not present

## 2016-03-09 DIAGNOSIS — M1990 Unspecified osteoarthritis, unspecified site: Secondary | ICD-10-CM | POA: Diagnosis not present

## 2016-03-09 DIAGNOSIS — M6281 Muscle weakness (generalized): Secondary | ICD-10-CM | POA: Diagnosis not present

## 2016-03-09 DIAGNOSIS — K219 Gastro-esophageal reflux disease without esophagitis: Secondary | ICD-10-CM | POA: Diagnosis not present

## 2016-03-09 DIAGNOSIS — M25462 Effusion, left knee: Secondary | ICD-10-CM | POA: Diagnosis not present

## 2016-03-14 DIAGNOSIS — M9904 Segmental and somatic dysfunction of sacral region: Secondary | ICD-10-CM | POA: Diagnosis not present

## 2016-03-14 DIAGNOSIS — M9902 Segmental and somatic dysfunction of thoracic region: Secondary | ICD-10-CM | POA: Diagnosis not present

## 2016-03-14 DIAGNOSIS — I340 Nonrheumatic mitral (valve) insufficiency: Secondary | ICD-10-CM | POA: Diagnosis not present

## 2016-03-14 DIAGNOSIS — M9903 Segmental and somatic dysfunction of lumbar region: Secondary | ICD-10-CM | POA: Diagnosis not present

## 2016-03-14 DIAGNOSIS — I10 Essential (primary) hypertension: Secondary | ICD-10-CM | POA: Diagnosis not present

## 2016-03-14 DIAGNOSIS — M5136 Other intervertebral disc degeneration, lumbar region: Secondary | ICD-10-CM | POA: Diagnosis not present

## 2016-03-14 DIAGNOSIS — I350 Nonrheumatic aortic (valve) stenosis: Secondary | ICD-10-CM | POA: Diagnosis not present

## 2016-03-14 DIAGNOSIS — M5137 Other intervertebral disc degeneration, lumbosacral region: Secondary | ICD-10-CM | POA: Diagnosis not present

## 2016-03-14 DIAGNOSIS — Z955 Presence of coronary angioplasty implant and graft: Secondary | ICD-10-CM | POA: Diagnosis not present

## 2016-03-14 DIAGNOSIS — I2510 Atherosclerotic heart disease of native coronary artery without angina pectoris: Secondary | ICD-10-CM | POA: Diagnosis not present

## 2016-03-14 DIAGNOSIS — M5135 Other intervertebral disc degeneration, thoracolumbar region: Secondary | ICD-10-CM | POA: Diagnosis not present

## 2016-03-15 DIAGNOSIS — M5136 Other intervertebral disc degeneration, lumbar region: Secondary | ICD-10-CM | POA: Diagnosis not present

## 2016-03-15 DIAGNOSIS — M9902 Segmental and somatic dysfunction of thoracic region: Secondary | ICD-10-CM | POA: Diagnosis not present

## 2016-03-15 DIAGNOSIS — M5137 Other intervertebral disc degeneration, lumbosacral region: Secondary | ICD-10-CM | POA: Diagnosis not present

## 2016-03-15 DIAGNOSIS — M5135 Other intervertebral disc degeneration, thoracolumbar region: Secondary | ICD-10-CM | POA: Diagnosis not present

## 2016-03-15 DIAGNOSIS — M9903 Segmental and somatic dysfunction of lumbar region: Secondary | ICD-10-CM | POA: Diagnosis not present

## 2016-03-15 DIAGNOSIS — M9904 Segmental and somatic dysfunction of sacral region: Secondary | ICD-10-CM | POA: Diagnosis not present

## 2016-03-16 DIAGNOSIS — M9903 Segmental and somatic dysfunction of lumbar region: Secondary | ICD-10-CM | POA: Diagnosis not present

## 2016-03-16 DIAGNOSIS — M9904 Segmental and somatic dysfunction of sacral region: Secondary | ICD-10-CM | POA: Diagnosis not present

## 2016-03-16 DIAGNOSIS — M5137 Other intervertebral disc degeneration, lumbosacral region: Secondary | ICD-10-CM | POA: Diagnosis not present

## 2016-03-16 DIAGNOSIS — M5136 Other intervertebral disc degeneration, lumbar region: Secondary | ICD-10-CM | POA: Diagnosis not present

## 2016-03-16 DIAGNOSIS — M9902 Segmental and somatic dysfunction of thoracic region: Secondary | ICD-10-CM | POA: Diagnosis not present

## 2016-03-16 DIAGNOSIS — M5135 Other intervertebral disc degeneration, thoracolumbar region: Secondary | ICD-10-CM | POA: Diagnosis not present

## 2016-03-17 DIAGNOSIS — I2510 Atherosclerotic heart disease of native coronary artery without angina pectoris: Secondary | ICD-10-CM | POA: Diagnosis not present

## 2016-03-17 DIAGNOSIS — Z9861 Coronary angioplasty status: Secondary | ICD-10-CM | POA: Diagnosis not present

## 2016-03-17 DIAGNOSIS — Z955 Presence of coronary angioplasty implant and graft: Secondary | ICD-10-CM | POA: Diagnosis not present

## 2016-03-17 DIAGNOSIS — G629 Polyneuropathy, unspecified: Secondary | ICD-10-CM | POA: Diagnosis not present

## 2016-03-21 DIAGNOSIS — H52223 Regular astigmatism, bilateral: Secondary | ICD-10-CM | POA: Diagnosis not present

## 2016-03-21 DIAGNOSIS — H43812 Vitreous degeneration, left eye: Secondary | ICD-10-CM | POA: Diagnosis not present

## 2016-03-21 DIAGNOSIS — H26492 Other secondary cataract, left eye: Secondary | ICD-10-CM | POA: Diagnosis not present

## 2016-03-21 DIAGNOSIS — H40011 Open angle with borderline findings, low risk, right eye: Secondary | ICD-10-CM | POA: Diagnosis not present

## 2016-03-21 DIAGNOSIS — H04123 Dry eye syndrome of bilateral lacrimal glands: Secondary | ICD-10-CM | POA: Diagnosis not present

## 2016-03-21 DIAGNOSIS — H524 Presbyopia: Secondary | ICD-10-CM | POA: Diagnosis not present

## 2016-03-21 DIAGNOSIS — I350 Nonrheumatic aortic (valve) stenosis: Secondary | ICD-10-CM | POA: Diagnosis not present

## 2016-03-21 DIAGNOSIS — H401121 Primary open-angle glaucoma, left eye, mild stage: Secondary | ICD-10-CM | POA: Diagnosis not present

## 2016-03-21 DIAGNOSIS — H35373 Puckering of macula, bilateral: Secondary | ICD-10-CM | POA: Diagnosis not present

## 2016-03-22 DIAGNOSIS — M5137 Other intervertebral disc degeneration, lumbosacral region: Secondary | ICD-10-CM | POA: Diagnosis not present

## 2016-03-22 DIAGNOSIS — M9904 Segmental and somatic dysfunction of sacral region: Secondary | ICD-10-CM | POA: Diagnosis not present

## 2016-03-22 DIAGNOSIS — M5136 Other intervertebral disc degeneration, lumbar region: Secondary | ICD-10-CM | POA: Diagnosis not present

## 2016-03-22 DIAGNOSIS — M9903 Segmental and somatic dysfunction of lumbar region: Secondary | ICD-10-CM | POA: Diagnosis not present

## 2016-03-22 DIAGNOSIS — M5135 Other intervertebral disc degeneration, thoracolumbar region: Secondary | ICD-10-CM | POA: Diagnosis not present

## 2016-03-22 DIAGNOSIS — M9902 Segmental and somatic dysfunction of thoracic region: Secondary | ICD-10-CM | POA: Diagnosis not present

## 2016-03-24 DIAGNOSIS — M5135 Other intervertebral disc degeneration, thoracolumbar region: Secondary | ICD-10-CM | POA: Diagnosis not present

## 2016-03-24 DIAGNOSIS — M9904 Segmental and somatic dysfunction of sacral region: Secondary | ICD-10-CM | POA: Diagnosis not present

## 2016-03-24 DIAGNOSIS — M5137 Other intervertebral disc degeneration, lumbosacral region: Secondary | ICD-10-CM | POA: Diagnosis not present

## 2016-03-24 DIAGNOSIS — M1712 Unilateral primary osteoarthritis, left knee: Secondary | ICD-10-CM | POA: Diagnosis not present

## 2016-03-24 DIAGNOSIS — M9902 Segmental and somatic dysfunction of thoracic region: Secondary | ICD-10-CM | POA: Diagnosis not present

## 2016-03-24 DIAGNOSIS — M9903 Segmental and somatic dysfunction of lumbar region: Secondary | ICD-10-CM | POA: Diagnosis not present

## 2016-03-24 DIAGNOSIS — M5136 Other intervertebral disc degeneration, lumbar region: Secondary | ICD-10-CM | POA: Diagnosis not present

## 2016-03-28 DIAGNOSIS — G4733 Obstructive sleep apnea (adult) (pediatric): Secondary | ICD-10-CM | POA: Diagnosis not present

## 2016-03-31 DIAGNOSIS — M1712 Unilateral primary osteoarthritis, left knee: Secondary | ICD-10-CM | POA: Diagnosis not present

## 2016-04-09 DIAGNOSIS — M25462 Effusion, left knee: Secondary | ICD-10-CM | POA: Diagnosis not present

## 2016-04-09 DIAGNOSIS — Z9181 History of falling: Secondary | ICD-10-CM | POA: Diagnosis not present

## 2016-04-09 DIAGNOSIS — R2689 Other abnormalities of gait and mobility: Secondary | ICD-10-CM | POA: Diagnosis not present

## 2016-04-09 DIAGNOSIS — S76102D Unspecified injury of left quadriceps muscle, fascia and tendon, subsequent encounter: Secondary | ICD-10-CM | POA: Diagnosis not present

## 2016-04-09 DIAGNOSIS — M1990 Unspecified osteoarthritis, unspecified site: Secondary | ICD-10-CM | POA: Diagnosis not present

## 2016-04-09 DIAGNOSIS — R269 Unspecified abnormalities of gait and mobility: Secondary | ICD-10-CM | POA: Diagnosis not present

## 2016-04-09 DIAGNOSIS — K219 Gastro-esophageal reflux disease without esophagitis: Secondary | ICD-10-CM | POA: Diagnosis not present

## 2016-04-09 DIAGNOSIS — G40209 Localization-related (focal) (partial) symptomatic epilepsy and epileptic syndromes with complex partial seizures, not intractable, without status epilepticus: Secondary | ICD-10-CM | POA: Diagnosis not present

## 2016-04-09 DIAGNOSIS — G9340 Encephalopathy, unspecified: Secondary | ICD-10-CM | POA: Diagnosis not present

## 2016-04-09 DIAGNOSIS — M6281 Muscle weakness (generalized): Secondary | ICD-10-CM | POA: Diagnosis not present

## 2016-04-09 DIAGNOSIS — E871 Hypo-osmolality and hyponatremia: Secondary | ICD-10-CM | POA: Diagnosis not present

## 2016-04-09 DIAGNOSIS — G4733 Obstructive sleep apnea (adult) (pediatric): Secondary | ICD-10-CM | POA: Diagnosis not present

## 2016-04-12 DIAGNOSIS — I350 Nonrheumatic aortic (valve) stenosis: Secondary | ICD-10-CM | POA: Diagnosis not present

## 2016-04-12 DIAGNOSIS — I340 Nonrheumatic mitral (valve) insufficiency: Secondary | ICD-10-CM | POA: Diagnosis not present

## 2016-04-12 DIAGNOSIS — I10 Essential (primary) hypertension: Secondary | ICD-10-CM | POA: Diagnosis not present

## 2016-04-12 DIAGNOSIS — I2510 Atherosclerotic heart disease of native coronary artery without angina pectoris: Secondary | ICD-10-CM | POA: Diagnosis not present

## 2016-04-14 DIAGNOSIS — M1712 Unilateral primary osteoarthritis, left knee: Secondary | ICD-10-CM | POA: Diagnosis not present

## 2016-04-21 DIAGNOSIS — M1712 Unilateral primary osteoarthritis, left knee: Secondary | ICD-10-CM | POA: Diagnosis not present

## 2016-05-05 DIAGNOSIS — I10 Essential (primary) hypertension: Secondary | ICD-10-CM | POA: Diagnosis not present

## 2016-05-09 DIAGNOSIS — S76102D Unspecified injury of left quadriceps muscle, fascia and tendon, subsequent encounter: Secondary | ICD-10-CM | POA: Diagnosis not present

## 2016-05-09 DIAGNOSIS — M1990 Unspecified osteoarthritis, unspecified site: Secondary | ICD-10-CM | POA: Diagnosis not present

## 2016-05-09 DIAGNOSIS — M25462 Effusion, left knee: Secondary | ICD-10-CM | POA: Diagnosis not present

## 2016-05-09 DIAGNOSIS — Z9181 History of falling: Secondary | ICD-10-CM | POA: Diagnosis not present

## 2016-05-09 DIAGNOSIS — G40209 Localization-related (focal) (partial) symptomatic epilepsy and epileptic syndromes with complex partial seizures, not intractable, without status epilepticus: Secondary | ICD-10-CM | POA: Diagnosis not present

## 2016-05-09 DIAGNOSIS — G9340 Encephalopathy, unspecified: Secondary | ICD-10-CM | POA: Diagnosis not present

## 2016-05-09 DIAGNOSIS — G4733 Obstructive sleep apnea (adult) (pediatric): Secondary | ICD-10-CM | POA: Diagnosis not present

## 2016-05-09 DIAGNOSIS — K219 Gastro-esophageal reflux disease without esophagitis: Secondary | ICD-10-CM | POA: Diagnosis not present

## 2016-05-09 DIAGNOSIS — M6281 Muscle weakness (generalized): Secondary | ICD-10-CM | POA: Diagnosis not present

## 2016-05-09 DIAGNOSIS — R269 Unspecified abnormalities of gait and mobility: Secondary | ICD-10-CM | POA: Diagnosis not present

## 2016-05-09 DIAGNOSIS — E871 Hypo-osmolality and hyponatremia: Secondary | ICD-10-CM | POA: Diagnosis not present

## 2016-05-09 DIAGNOSIS — R2689 Other abnormalities of gait and mobility: Secondary | ICD-10-CM | POA: Diagnosis not present

## 2016-05-23 DIAGNOSIS — K4091 Unilateral inguinal hernia, without obstruction or gangrene, recurrent: Secondary | ICD-10-CM | POA: Diagnosis not present

## 2016-05-23 DIAGNOSIS — G629 Polyneuropathy, unspecified: Secondary | ICD-10-CM | POA: Diagnosis not present

## 2016-05-23 DIAGNOSIS — M5442 Lumbago with sciatica, left side: Secondary | ICD-10-CM | POA: Diagnosis not present

## 2016-05-23 DIAGNOSIS — G8929 Other chronic pain: Secondary | ICD-10-CM | POA: Diagnosis not present

## 2016-06-09 DIAGNOSIS — E871 Hypo-osmolality and hyponatremia: Secondary | ICD-10-CM | POA: Diagnosis not present

## 2016-06-09 DIAGNOSIS — G4733 Obstructive sleep apnea (adult) (pediatric): Secondary | ICD-10-CM | POA: Diagnosis not present

## 2016-06-09 DIAGNOSIS — M25462 Effusion, left knee: Secondary | ICD-10-CM | POA: Diagnosis not present

## 2016-06-09 DIAGNOSIS — S76102D Unspecified injury of left quadriceps muscle, fascia and tendon, subsequent encounter: Secondary | ICD-10-CM | POA: Diagnosis not present

## 2016-06-09 DIAGNOSIS — G9340 Encephalopathy, unspecified: Secondary | ICD-10-CM | POA: Diagnosis not present

## 2016-06-09 DIAGNOSIS — G40209 Localization-related (focal) (partial) symptomatic epilepsy and epileptic syndromes with complex partial seizures, not intractable, without status epilepticus: Secondary | ICD-10-CM | POA: Diagnosis not present

## 2016-06-09 DIAGNOSIS — R2689 Other abnormalities of gait and mobility: Secondary | ICD-10-CM | POA: Diagnosis not present

## 2016-06-09 DIAGNOSIS — Z9181 History of falling: Secondary | ICD-10-CM | POA: Diagnosis not present

## 2016-06-09 DIAGNOSIS — K219 Gastro-esophageal reflux disease without esophagitis: Secondary | ICD-10-CM | POA: Diagnosis not present

## 2016-06-09 DIAGNOSIS — R269 Unspecified abnormalities of gait and mobility: Secondary | ICD-10-CM | POA: Diagnosis not present

## 2016-06-09 DIAGNOSIS — M6281 Muscle weakness (generalized): Secondary | ICD-10-CM | POA: Diagnosis not present

## 2016-06-09 DIAGNOSIS — M1990 Unspecified osteoarthritis, unspecified site: Secondary | ICD-10-CM | POA: Diagnosis not present

## 2016-06-14 DIAGNOSIS — I10 Essential (primary) hypertension: Secondary | ICD-10-CM | POA: Diagnosis not present

## 2016-06-14 DIAGNOSIS — G629 Polyneuropathy, unspecified: Secondary | ICD-10-CM | POA: Diagnosis not present

## 2016-06-28 DIAGNOSIS — Z8673 Personal history of transient ischemic attack (TIA), and cerebral infarction without residual deficits: Secondary | ICD-10-CM | POA: Diagnosis not present

## 2016-06-28 DIAGNOSIS — Z87898 Personal history of other specified conditions: Secondary | ICD-10-CM | POA: Diagnosis not present

## 2016-07-09 DIAGNOSIS — M25462 Effusion, left knee: Secondary | ICD-10-CM | POA: Diagnosis not present

## 2016-07-09 DIAGNOSIS — R2689 Other abnormalities of gait and mobility: Secondary | ICD-10-CM | POA: Diagnosis not present

## 2016-07-09 DIAGNOSIS — S76102D Unspecified injury of left quadriceps muscle, fascia and tendon, subsequent encounter: Secondary | ICD-10-CM | POA: Diagnosis not present

## 2016-07-09 DIAGNOSIS — R269 Unspecified abnormalities of gait and mobility: Secondary | ICD-10-CM | POA: Diagnosis not present

## 2016-07-09 DIAGNOSIS — E871 Hypo-osmolality and hyponatremia: Secondary | ICD-10-CM | POA: Diagnosis not present

## 2016-07-09 DIAGNOSIS — G9340 Encephalopathy, unspecified: Secondary | ICD-10-CM | POA: Diagnosis not present

## 2016-07-09 DIAGNOSIS — K219 Gastro-esophageal reflux disease without esophagitis: Secondary | ICD-10-CM | POA: Diagnosis not present

## 2016-07-09 DIAGNOSIS — Z9181 History of falling: Secondary | ICD-10-CM | POA: Diagnosis not present

## 2016-07-09 DIAGNOSIS — M6281 Muscle weakness (generalized): Secondary | ICD-10-CM | POA: Diagnosis not present

## 2016-07-09 DIAGNOSIS — G4733 Obstructive sleep apnea (adult) (pediatric): Secondary | ICD-10-CM | POA: Diagnosis not present

## 2016-07-09 DIAGNOSIS — G40209 Localization-related (focal) (partial) symptomatic epilepsy and epileptic syndromes with complex partial seizures, not intractable, without status epilepticus: Secondary | ICD-10-CM | POA: Diagnosis not present

## 2016-07-09 DIAGNOSIS — M1990 Unspecified osteoarthritis, unspecified site: Secondary | ICD-10-CM | POA: Diagnosis not present

## 2016-07-11 DIAGNOSIS — K4091 Unilateral inguinal hernia, without obstruction or gangrene, recurrent: Secondary | ICD-10-CM | POA: Diagnosis not present

## 2016-07-13 DIAGNOSIS — R1031 Right lower quadrant pain: Secondary | ICD-10-CM | POA: Diagnosis not present

## 2016-07-13 DIAGNOSIS — M79671 Pain in right foot: Secondary | ICD-10-CM | POA: Diagnosis not present

## 2016-07-13 DIAGNOSIS — Z8719 Personal history of other diseases of the digestive system: Secondary | ICD-10-CM | POA: Diagnosis not present

## 2016-07-13 DIAGNOSIS — Z9889 Other specified postprocedural states: Secondary | ICD-10-CM | POA: Diagnosis not present

## 2016-07-15 DIAGNOSIS — K5900 Constipation, unspecified: Secondary | ICD-10-CM | POA: Diagnosis not present

## 2016-07-15 DIAGNOSIS — R1031 Right lower quadrant pain: Secondary | ICD-10-CM | POA: Diagnosis not present

## 2016-07-15 DIAGNOSIS — K4090 Unilateral inguinal hernia, without obstruction or gangrene, not specified as recurrent: Secondary | ICD-10-CM | POA: Diagnosis not present

## 2016-07-15 DIAGNOSIS — Z9889 Other specified postprocedural states: Secondary | ICD-10-CM | POA: Diagnosis not present

## 2016-07-15 DIAGNOSIS — K5790 Diverticulosis of intestine, part unspecified, without perforation or abscess without bleeding: Secondary | ICD-10-CM | POA: Diagnosis not present

## 2016-07-15 DIAGNOSIS — Z8719 Personal history of other diseases of the digestive system: Secondary | ICD-10-CM | POA: Diagnosis not present

## 2016-08-04 DIAGNOSIS — I10 Essential (primary) hypertension: Secondary | ICD-10-CM | POA: Diagnosis not present

## 2016-08-04 DIAGNOSIS — G319 Degenerative disease of nervous system, unspecified: Secondary | ICD-10-CM | POA: Diagnosis not present

## 2016-08-04 DIAGNOSIS — R51 Headache: Secondary | ICD-10-CM | POA: Diagnosis not present

## 2016-08-04 DIAGNOSIS — I2510 Atherosclerotic heart disease of native coronary artery without angina pectoris: Secondary | ICD-10-CM | POA: Diagnosis not present

## 2016-08-04 DIAGNOSIS — Z885 Allergy status to narcotic agent status: Secondary | ICD-10-CM | POA: Diagnosis not present

## 2016-08-04 DIAGNOSIS — Z7901 Long term (current) use of anticoagulants: Secondary | ICD-10-CM | POA: Diagnosis not present

## 2016-08-04 DIAGNOSIS — Z7982 Long term (current) use of aspirin: Secondary | ICD-10-CM | POA: Diagnosis not present

## 2016-08-04 DIAGNOSIS — M8588 Other specified disorders of bone density and structure, other site: Secondary | ICD-10-CM | POA: Diagnosis not present

## 2016-08-04 DIAGNOSIS — Z87891 Personal history of nicotine dependence: Secondary | ICD-10-CM | POA: Diagnosis not present

## 2016-08-04 DIAGNOSIS — M47896 Other spondylosis, lumbar region: Secondary | ICD-10-CM | POA: Diagnosis not present

## 2016-08-04 DIAGNOSIS — Z8673 Personal history of transient ischemic attack (TIA), and cerebral infarction without residual deficits: Secondary | ICD-10-CM | POA: Diagnosis not present

## 2016-08-04 DIAGNOSIS — M5136 Other intervertebral disc degeneration, lumbar region: Secondary | ICD-10-CM | POA: Diagnosis not present

## 2016-08-04 DIAGNOSIS — S0990XA Unspecified injury of head, initial encounter: Secondary | ICD-10-CM | POA: Diagnosis not present

## 2016-08-04 DIAGNOSIS — S3992XA Unspecified injury of lower back, initial encounter: Secondary | ICD-10-CM | POA: Diagnosis not present

## 2016-08-04 DIAGNOSIS — W010XXA Fall on same level from slipping, tripping and stumbling without subsequent striking against object, initial encounter: Secondary | ICD-10-CM | POA: Diagnosis not present

## 2016-08-04 DIAGNOSIS — Z79899 Other long term (current) drug therapy: Secondary | ICD-10-CM | POA: Diagnosis not present

## 2016-08-04 DIAGNOSIS — Z888 Allergy status to other drugs, medicaments and biological substances status: Secondary | ICD-10-CM | POA: Diagnosis not present

## 2016-08-09 DIAGNOSIS — E871 Hypo-osmolality and hyponatremia: Secondary | ICD-10-CM | POA: Diagnosis not present

## 2016-08-09 DIAGNOSIS — R2689 Other abnormalities of gait and mobility: Secondary | ICD-10-CM | POA: Diagnosis not present

## 2016-08-09 DIAGNOSIS — Z9181 History of falling: Secondary | ICD-10-CM | POA: Diagnosis not present

## 2016-08-09 DIAGNOSIS — R269 Unspecified abnormalities of gait and mobility: Secondary | ICD-10-CM | POA: Diagnosis not present

## 2016-08-09 DIAGNOSIS — K219 Gastro-esophageal reflux disease without esophagitis: Secondary | ICD-10-CM | POA: Diagnosis not present

## 2016-08-09 DIAGNOSIS — S76102D Unspecified injury of left quadriceps muscle, fascia and tendon, subsequent encounter: Secondary | ICD-10-CM | POA: Diagnosis not present

## 2016-08-09 DIAGNOSIS — M1990 Unspecified osteoarthritis, unspecified site: Secondary | ICD-10-CM | POA: Diagnosis not present

## 2016-08-09 DIAGNOSIS — M6281 Muscle weakness (generalized): Secondary | ICD-10-CM | POA: Diagnosis not present

## 2016-08-09 DIAGNOSIS — M25462 Effusion, left knee: Secondary | ICD-10-CM | POA: Diagnosis not present

## 2016-08-09 DIAGNOSIS — G9340 Encephalopathy, unspecified: Secondary | ICD-10-CM | POA: Diagnosis not present

## 2016-08-09 DIAGNOSIS — G4733 Obstructive sleep apnea (adult) (pediatric): Secondary | ICD-10-CM | POA: Diagnosis not present

## 2016-08-09 DIAGNOSIS — G40209 Localization-related (focal) (partial) symptomatic epilepsy and epileptic syndromes with complex partial seizures, not intractable, without status epilepticus: Secondary | ICD-10-CM | POA: Diagnosis not present

## 2016-08-25 DIAGNOSIS — M1712 Unilateral primary osteoarthritis, left knee: Secondary | ICD-10-CM | POA: Diagnosis not present

## 2016-09-09 DIAGNOSIS — G40209 Localization-related (focal) (partial) symptomatic epilepsy and epileptic syndromes with complex partial seizures, not intractable, without status epilepticus: Secondary | ICD-10-CM | POA: Diagnosis not present

## 2016-09-09 DIAGNOSIS — G9340 Encephalopathy, unspecified: Secondary | ICD-10-CM | POA: Diagnosis not present

## 2016-09-09 DIAGNOSIS — K219 Gastro-esophageal reflux disease without esophagitis: Secondary | ICD-10-CM | POA: Diagnosis not present

## 2016-09-09 DIAGNOSIS — E871 Hypo-osmolality and hyponatremia: Secondary | ICD-10-CM | POA: Diagnosis not present

## 2016-09-09 DIAGNOSIS — R269 Unspecified abnormalities of gait and mobility: Secondary | ICD-10-CM | POA: Diagnosis not present

## 2016-09-09 DIAGNOSIS — M1990 Unspecified osteoarthritis, unspecified site: Secondary | ICD-10-CM | POA: Diagnosis not present

## 2016-09-09 DIAGNOSIS — M25462 Effusion, left knee: Secondary | ICD-10-CM | POA: Diagnosis not present

## 2016-09-09 DIAGNOSIS — Z9181 History of falling: Secondary | ICD-10-CM | POA: Diagnosis not present

## 2016-09-09 DIAGNOSIS — S76102D Unspecified injury of left quadriceps muscle, fascia and tendon, subsequent encounter: Secondary | ICD-10-CM | POA: Diagnosis not present

## 2016-09-09 DIAGNOSIS — G4733 Obstructive sleep apnea (adult) (pediatric): Secondary | ICD-10-CM | POA: Diagnosis not present

## 2016-09-09 DIAGNOSIS — M6281 Muscle weakness (generalized): Secondary | ICD-10-CM | POA: Diagnosis not present

## 2016-09-09 DIAGNOSIS — R2689 Other abnormalities of gait and mobility: Secondary | ICD-10-CM | POA: Diagnosis not present

## 2016-09-21 DIAGNOSIS — H40011 Open angle with borderline findings, low risk, right eye: Secondary | ICD-10-CM | POA: Diagnosis not present

## 2016-09-21 DIAGNOSIS — H524 Presbyopia: Secondary | ICD-10-CM | POA: Diagnosis not present

## 2016-09-21 DIAGNOSIS — H35373 Puckering of macula, bilateral: Secondary | ICD-10-CM | POA: Diagnosis not present

## 2016-09-21 DIAGNOSIS — H43812 Vitreous degeneration, left eye: Secondary | ICD-10-CM | POA: Diagnosis not present

## 2016-09-21 DIAGNOSIS — H04123 Dry eye syndrome of bilateral lacrimal glands: Secondary | ICD-10-CM | POA: Diagnosis not present

## 2016-09-21 DIAGNOSIS — H26492 Other secondary cataract, left eye: Secondary | ICD-10-CM | POA: Diagnosis not present

## 2016-09-21 DIAGNOSIS — H52223 Regular astigmatism, bilateral: Secondary | ICD-10-CM | POA: Diagnosis not present

## 2016-09-21 DIAGNOSIS — H401121 Primary open-angle glaucoma, left eye, mild stage: Secondary | ICD-10-CM | POA: Diagnosis not present

## 2016-10-03 DIAGNOSIS — G4733 Obstructive sleep apnea (adult) (pediatric): Secondary | ICD-10-CM | POA: Diagnosis not present

## 2016-10-06 DIAGNOSIS — R0602 Shortness of breath: Secondary | ICD-10-CM | POA: Diagnosis not present

## 2016-10-06 DIAGNOSIS — I350 Nonrheumatic aortic (valve) stenosis: Secondary | ICD-10-CM | POA: Diagnosis not present

## 2016-10-06 DIAGNOSIS — I10 Essential (primary) hypertension: Secondary | ICD-10-CM | POA: Diagnosis not present

## 2016-10-06 DIAGNOSIS — I25110 Atherosclerotic heart disease of native coronary artery with unstable angina pectoris: Secondary | ICD-10-CM | POA: Diagnosis not present

## 2016-10-13 DIAGNOSIS — Z888 Allergy status to other drugs, medicaments and biological substances status: Secondary | ICD-10-CM | POA: Diagnosis not present

## 2016-10-13 DIAGNOSIS — Z79899 Other long term (current) drug therapy: Secondary | ICD-10-CM | POA: Diagnosis not present

## 2016-10-13 DIAGNOSIS — I2510 Atherosclerotic heart disease of native coronary artery without angina pectoris: Secondary | ICD-10-CM | POA: Diagnosis not present

## 2016-10-13 DIAGNOSIS — D649 Anemia, unspecified: Secondary | ICD-10-CM | POA: Diagnosis not present

## 2016-10-13 DIAGNOSIS — I10 Essential (primary) hypertension: Secondary | ICD-10-CM | POA: Diagnosis not present

## 2016-10-13 DIAGNOSIS — D123 Benign neoplasm of transverse colon: Secondary | ICD-10-CM | POA: Diagnosis not present

## 2016-10-13 DIAGNOSIS — I350 Nonrheumatic aortic (valve) stenosis: Secondary | ICD-10-CM | POA: Diagnosis not present

## 2016-10-13 DIAGNOSIS — K31819 Angiodysplasia of stomach and duodenum without bleeding: Secondary | ICD-10-CM | POA: Diagnosis not present

## 2016-10-13 DIAGNOSIS — K219 Gastro-esophageal reflux disease without esophagitis: Secondary | ICD-10-CM | POA: Diagnosis not present

## 2016-10-13 DIAGNOSIS — K5520 Angiodysplasia of colon without hemorrhage: Secondary | ICD-10-CM | POA: Diagnosis not present

## 2016-10-13 DIAGNOSIS — K648 Other hemorrhoids: Secondary | ICD-10-CM | POA: Diagnosis not present

## 2016-10-13 DIAGNOSIS — R0609 Other forms of dyspnea: Secondary | ICD-10-CM | POA: Diagnosis not present

## 2016-10-13 DIAGNOSIS — Z8673 Personal history of transient ischemic attack (TIA), and cerebral infarction without residual deficits: Secondary | ICD-10-CM | POA: Diagnosis not present

## 2016-10-13 DIAGNOSIS — K5730 Diverticulosis of large intestine without perforation or abscess without bleeding: Secondary | ICD-10-CM | POA: Diagnosis not present

## 2016-10-13 DIAGNOSIS — Z955 Presence of coronary angioplasty implant and graft: Secondary | ICD-10-CM | POA: Diagnosis not present

## 2016-10-13 DIAGNOSIS — I252 Old myocardial infarction: Secondary | ICD-10-CM | POA: Diagnosis not present

## 2016-10-13 DIAGNOSIS — E785 Hyperlipidemia, unspecified: Secondary | ICD-10-CM | POA: Diagnosis not present

## 2016-10-13 DIAGNOSIS — Z7982 Long term (current) use of aspirin: Secondary | ICD-10-CM | POA: Diagnosis not present

## 2016-10-13 DIAGNOSIS — R569 Unspecified convulsions: Secondary | ICD-10-CM | POA: Diagnosis not present

## 2016-10-13 DIAGNOSIS — Z885 Allergy status to narcotic agent status: Secondary | ICD-10-CM | POA: Diagnosis not present

## 2016-10-13 DIAGNOSIS — G4733 Obstructive sleep apnea (adult) (pediatric): Secondary | ICD-10-CM | POA: Diagnosis not present

## 2016-10-14 DIAGNOSIS — K219 Gastro-esophageal reflux disease without esophagitis: Secondary | ICD-10-CM | POA: Diagnosis not present

## 2016-10-14 DIAGNOSIS — I10 Essential (primary) hypertension: Secondary | ICD-10-CM | POA: Diagnosis not present

## 2016-10-14 DIAGNOSIS — D649 Anemia, unspecified: Secondary | ICD-10-CM | POA: Diagnosis not present

## 2016-10-14 DIAGNOSIS — Z955 Presence of coronary angioplasty implant and graft: Secondary | ICD-10-CM | POA: Diagnosis not present

## 2016-10-14 DIAGNOSIS — R0609 Other forms of dyspnea: Secondary | ICD-10-CM | POA: Diagnosis not present

## 2016-10-14 DIAGNOSIS — I25119 Atherosclerotic heart disease of native coronary artery with unspecified angina pectoris: Secondary | ICD-10-CM | POA: Diagnosis not present

## 2016-10-15 DIAGNOSIS — Z9989 Dependence on other enabling machines and devices: Secondary | ICD-10-CM | POA: Diagnosis not present

## 2016-10-15 DIAGNOSIS — K5520 Angiodysplasia of colon without hemorrhage: Secondary | ICD-10-CM | POA: Diagnosis not present

## 2016-10-15 DIAGNOSIS — R0609 Other forms of dyspnea: Secondary | ICD-10-CM | POA: Diagnosis not present

## 2016-10-15 DIAGNOSIS — R0789 Other chest pain: Secondary | ICD-10-CM | POA: Diagnosis not present

## 2016-10-15 DIAGNOSIS — I998 Other disorder of circulatory system: Secondary | ICD-10-CM | POA: Diagnosis not present

## 2016-10-15 DIAGNOSIS — I25119 Atherosclerotic heart disease of native coronary artery with unspecified angina pectoris: Secondary | ICD-10-CM | POA: Diagnosis not present

## 2016-10-15 DIAGNOSIS — D649 Anemia, unspecified: Secondary | ICD-10-CM | POA: Diagnosis not present

## 2016-10-15 DIAGNOSIS — K219 Gastro-esophageal reflux disease without esophagitis: Secondary | ICD-10-CM | POA: Diagnosis not present

## 2016-10-15 DIAGNOSIS — G4733 Obstructive sleep apnea (adult) (pediatric): Secondary | ICD-10-CM | POA: Diagnosis not present

## 2016-10-15 DIAGNOSIS — D123 Benign neoplasm of transverse colon: Secondary | ICD-10-CM | POA: Diagnosis not present

## 2016-10-15 DIAGNOSIS — K31819 Angiodysplasia of stomach and duodenum without bleeding: Secondary | ICD-10-CM | POA: Diagnosis not present

## 2016-10-16 DIAGNOSIS — R0609 Other forms of dyspnea: Secondary | ICD-10-CM | POA: Diagnosis not present

## 2016-10-16 DIAGNOSIS — I25119 Atherosclerotic heart disease of native coronary artery with unspecified angina pectoris: Secondary | ICD-10-CM | POA: Diagnosis not present

## 2016-10-16 DIAGNOSIS — K219 Gastro-esophageal reflux disease without esophagitis: Secondary | ICD-10-CM | POA: Diagnosis not present

## 2016-10-16 DIAGNOSIS — I998 Other disorder of circulatory system: Secondary | ICD-10-CM | POA: Diagnosis not present

## 2016-10-16 DIAGNOSIS — D649 Anemia, unspecified: Secondary | ICD-10-CM | POA: Diagnosis not present

## 2016-10-16 DIAGNOSIS — R0789 Other chest pain: Secondary | ICD-10-CM | POA: Diagnosis not present

## 2016-10-18 DIAGNOSIS — M1712 Unilateral primary osteoarthritis, left knee: Secondary | ICD-10-CM | POA: Diagnosis not present

## 2016-10-22 DIAGNOSIS — D62 Acute posthemorrhagic anemia: Secondary | ICD-10-CM | POA: Diagnosis not present

## 2016-10-22 DIAGNOSIS — E860 Dehydration: Secondary | ICD-10-CM | POA: Diagnosis not present

## 2016-10-22 DIAGNOSIS — Z9221 Personal history of antineoplastic chemotherapy: Secondary | ICD-10-CM | POA: Diagnosis not present

## 2016-10-22 DIAGNOSIS — K625 Hemorrhage of anus and rectum: Secondary | ICD-10-CM | POA: Diagnosis not present

## 2016-10-22 DIAGNOSIS — I452 Bifascicular block: Secondary | ICD-10-CM | POA: Diagnosis not present

## 2016-10-22 DIAGNOSIS — R569 Unspecified convulsions: Secondary | ICD-10-CM | POA: Diagnosis not present

## 2016-10-22 DIAGNOSIS — K921 Melena: Secondary | ICD-10-CM | POA: Diagnosis not present

## 2016-10-22 DIAGNOSIS — Z86010 Personal history of colonic polyps: Secondary | ICD-10-CM | POA: Diagnosis not present

## 2016-10-22 DIAGNOSIS — G40909 Epilepsy, unspecified, not intractable, without status epilepticus: Secondary | ICD-10-CM | POA: Diagnosis not present

## 2016-10-22 DIAGNOSIS — G629 Polyneuropathy, unspecified: Secondary | ICD-10-CM | POA: Diagnosis not present

## 2016-10-22 DIAGNOSIS — Z8571 Personal history of Hodgkin lymphoma: Secondary | ICD-10-CM | POA: Diagnosis not present

## 2016-10-22 DIAGNOSIS — E871 Hypo-osmolality and hyponatremia: Secondary | ICD-10-CM | POA: Diagnosis not present

## 2016-10-22 DIAGNOSIS — I2510 Atherosclerotic heart disease of native coronary artery without angina pectoris: Secondary | ICD-10-CM | POA: Diagnosis not present

## 2016-10-22 DIAGNOSIS — N400 Enlarged prostate without lower urinary tract symptoms: Secondary | ICD-10-CM | POA: Diagnosis not present

## 2016-10-22 DIAGNOSIS — K922 Gastrointestinal hemorrhage, unspecified: Secondary | ICD-10-CM | POA: Diagnosis not present

## 2016-10-22 DIAGNOSIS — E785 Hyperlipidemia, unspecified: Secondary | ICD-10-CM | POA: Diagnosis not present

## 2016-10-22 DIAGNOSIS — K91840 Postprocedural hemorrhage and hematoma of a digestive system organ or structure following a digestive system procedure: Secondary | ICD-10-CM | POA: Diagnosis not present

## 2016-10-22 DIAGNOSIS — I350 Nonrheumatic aortic (valve) stenosis: Secondary | ICD-10-CM | POA: Diagnosis not present

## 2016-10-22 DIAGNOSIS — Z955 Presence of coronary angioplasty implant and graft: Secondary | ICD-10-CM | POA: Diagnosis not present

## 2016-10-22 DIAGNOSIS — I252 Old myocardial infarction: Secondary | ICD-10-CM | POA: Diagnosis not present

## 2016-10-22 DIAGNOSIS — I10 Essential (primary) hypertension: Secondary | ICD-10-CM | POA: Diagnosis not present

## 2016-10-22 DIAGNOSIS — R10811 Right upper quadrant abdominal tenderness: Secondary | ICD-10-CM | POA: Diagnosis not present

## 2016-10-22 DIAGNOSIS — R079 Chest pain, unspecified: Secondary | ICD-10-CM | POA: Diagnosis not present

## 2016-10-22 DIAGNOSIS — Z7982 Long term (current) use of aspirin: Secondary | ICD-10-CM | POA: Diagnosis not present

## 2016-10-22 DIAGNOSIS — Z87891 Personal history of nicotine dependence: Secondary | ICD-10-CM | POA: Diagnosis not present

## 2016-10-31 ENCOUNTER — Other Ambulatory Visit: Payer: Self-pay

## 2016-10-31 NOTE — Patient Outreach (Signed)
[facility]) Care Management  10/31/2016  Cecile Guevara 1931-05-07 [id number]   Transition of care  Referral date: 10/31/16 Referral source: status post hospital discharge from [facility] on 10/26/16 Insurance: not listed Attempted call  Telephone call to patient regarding transition of care follow up.  Patient would not verify HIPAA. Patient states he does not know who [facility] is, never heard of them and does not want to talk with nurse calling.   PLAN:  RNCM will refer patient to care management assistant due to patient refusing call/ service RNCM will notify patients primary MD of closure.

## 2016-11-01 DIAGNOSIS — D582 Other hemoglobinopathies: Secondary | ICD-10-CM | POA: Diagnosis not present

## 2016-11-07 DIAGNOSIS — D649 Anemia, unspecified: Secondary | ICD-10-CM | POA: Diagnosis not present

## 2016-11-08 DIAGNOSIS — D62 Acute posthemorrhagic anemia: Secondary | ICD-10-CM | POA: Diagnosis not present

## 2016-11-08 DIAGNOSIS — Q2730 Arteriovenous malformation, site unspecified: Secondary | ICD-10-CM | POA: Diagnosis not present

## 2016-11-15 DIAGNOSIS — I10 Essential (primary) hypertension: Secondary | ICD-10-CM | POA: Diagnosis not present

## 2016-11-15 DIAGNOSIS — D649 Anemia, unspecified: Secondary | ICD-10-CM | POA: Diagnosis not present

## 2016-11-15 DIAGNOSIS — I2510 Atherosclerotic heart disease of native coronary artery without angina pectoris: Secondary | ICD-10-CM | POA: Diagnosis not present

## 2016-11-15 DIAGNOSIS — E785 Hyperlipidemia, unspecified: Secondary | ICD-10-CM | POA: Diagnosis not present

## 2016-11-15 DIAGNOSIS — I350 Nonrheumatic aortic (valve) stenosis: Secondary | ICD-10-CM | POA: Diagnosis not present

## 2016-11-28 DIAGNOSIS — I340 Nonrheumatic mitral (valve) insufficiency: Secondary | ICD-10-CM | POA: Diagnosis not present

## 2016-12-05 DIAGNOSIS — G250 Essential tremor: Secondary | ICD-10-CM | POA: Diagnosis not present

## 2016-12-05 DIAGNOSIS — I10 Essential (primary) hypertension: Secondary | ICD-10-CM | POA: Diagnosis not present

## 2016-12-05 DIAGNOSIS — Z8249 Family history of ischemic heart disease and other diseases of the circulatory system: Secondary | ICD-10-CM | POA: Diagnosis not present

## 2016-12-05 DIAGNOSIS — J189 Pneumonia, unspecified organism: Secondary | ICD-10-CM | POA: Diagnosis not present

## 2016-12-05 DIAGNOSIS — I350 Nonrheumatic aortic (valve) stenosis: Secondary | ICD-10-CM | POA: Diagnosis not present

## 2016-12-05 DIAGNOSIS — J168 Pneumonia due to other specified infectious organisms: Secondary | ICD-10-CM | POA: Diagnosis not present

## 2016-12-05 DIAGNOSIS — I2510 Atherosclerotic heart disease of native coronary artery without angina pectoris: Secondary | ICD-10-CM | POA: Diagnosis not present

## 2016-12-05 DIAGNOSIS — E871 Hypo-osmolality and hyponatremia: Secondary | ICD-10-CM | POA: Diagnosis not present

## 2016-12-05 DIAGNOSIS — I083 Combined rheumatic disorders of mitral, aortic and tricuspid valves: Secondary | ICD-10-CM | POA: Diagnosis not present

## 2016-12-05 DIAGNOSIS — E785 Hyperlipidemia, unspecified: Secondary | ICD-10-CM | POA: Diagnosis not present

## 2016-12-05 DIAGNOSIS — Z955 Presence of coronary angioplasty implant and graft: Secondary | ICD-10-CM | POA: Diagnosis not present

## 2016-12-05 DIAGNOSIS — I519 Heart disease, unspecified: Secondary | ICD-10-CM | POA: Diagnosis not present

## 2016-12-05 DIAGNOSIS — J181 Lobar pneumonia, unspecified organism: Secondary | ICD-10-CM | POA: Diagnosis not present

## 2016-12-05 DIAGNOSIS — Z7982 Long term (current) use of aspirin: Secondary | ICD-10-CM | POA: Diagnosis not present

## 2016-12-05 DIAGNOSIS — G4733 Obstructive sleep apnea (adult) (pediatric): Secondary | ICD-10-CM | POA: Diagnosis not present

## 2016-12-05 DIAGNOSIS — R918 Other nonspecific abnormal finding of lung field: Secondary | ICD-10-CM | POA: Diagnosis not present

## 2016-12-05 DIAGNOSIS — Z888 Allergy status to other drugs, medicaments and biological substances status: Secondary | ICD-10-CM | POA: Diagnosis not present

## 2016-12-05 DIAGNOSIS — K219 Gastro-esophageal reflux disease without esophagitis: Secondary | ICD-10-CM | POA: Diagnosis not present

## 2016-12-05 DIAGNOSIS — R0602 Shortness of breath: Secondary | ICD-10-CM | POA: Diagnosis not present

## 2016-12-05 DIAGNOSIS — N400 Enlarged prostate without lower urinary tract symptoms: Secondary | ICD-10-CM | POA: Diagnosis not present

## 2016-12-05 DIAGNOSIS — R079 Chest pain, unspecified: Secondary | ICD-10-CM | POA: Diagnosis not present

## 2016-12-05 DIAGNOSIS — R591 Generalized enlarged lymph nodes: Secondary | ICD-10-CM | POA: Diagnosis not present

## 2016-12-05 DIAGNOSIS — Q2733 Arteriovenous malformation of digestive system vessel: Secondary | ICD-10-CM | POA: Diagnosis not present

## 2016-12-05 DIAGNOSIS — Z79899 Other long term (current) drug therapy: Secondary | ICD-10-CM | POA: Diagnosis not present

## 2016-12-05 DIAGNOSIS — J9811 Atelectasis: Secondary | ICD-10-CM | POA: Diagnosis not present

## 2016-12-05 DIAGNOSIS — I517 Cardiomegaly: Secondary | ICD-10-CM | POA: Diagnosis not present

## 2016-12-05 DIAGNOSIS — G40909 Epilepsy, unspecified, not intractable, without status epilepticus: Secondary | ICD-10-CM | POA: Diagnosis not present

## 2016-12-05 DIAGNOSIS — I25119 Atherosclerotic heart disease of native coronary artery with unspecified angina pectoris: Secondary | ICD-10-CM | POA: Diagnosis not present

## 2016-12-05 DIAGNOSIS — T502X5A Adverse effect of carbonic-anhydrase inhibitors, benzothiadiazides and other diuretics, initial encounter: Secondary | ICD-10-CM | POA: Diagnosis not present

## 2016-12-15 DIAGNOSIS — D649 Anemia, unspecified: Secondary | ICD-10-CM | POA: Diagnosis not present

## 2016-12-15 DIAGNOSIS — N3281 Overactive bladder: Secondary | ICD-10-CM | POA: Diagnosis not present

## 2016-12-15 DIAGNOSIS — E785 Hyperlipidemia, unspecified: Secondary | ICD-10-CM | POA: Diagnosis not present

## 2016-12-15 DIAGNOSIS — I10 Essential (primary) hypertension: Secondary | ICD-10-CM | POA: Diagnosis not present

## 2016-12-15 DIAGNOSIS — Z9989 Dependence on other enabling machines and devices: Secondary | ICD-10-CM | POA: Diagnosis not present

## 2016-12-15 DIAGNOSIS — K219 Gastro-esophageal reflux disease without esophagitis: Secondary | ICD-10-CM | POA: Diagnosis not present

## 2016-12-15 DIAGNOSIS — I2510 Atherosclerotic heart disease of native coronary artery without angina pectoris: Secondary | ICD-10-CM | POA: Diagnosis not present

## 2016-12-15 DIAGNOSIS — J181 Lobar pneumonia, unspecified organism: Secondary | ICD-10-CM | POA: Diagnosis not present

## 2016-12-15 DIAGNOSIS — E538 Deficiency of other specified B group vitamins: Secondary | ICD-10-CM | POA: Diagnosis not present

## 2016-12-15 DIAGNOSIS — G40909 Epilepsy, unspecified, not intractable, without status epilepticus: Secondary | ICD-10-CM | POA: Diagnosis not present

## 2016-12-15 DIAGNOSIS — G4733 Obstructive sleep apnea (adult) (pediatric): Secondary | ICD-10-CM | POA: Diagnosis not present

## 2016-12-30 DIAGNOSIS — I10 Essential (primary) hypertension: Secondary | ICD-10-CM | POA: Diagnosis not present

## 2020-05-08 ENCOUNTER — Emergency Department (HOSPITAL_COMMUNITY): Payer: Medicare Other

## 2020-05-08 ENCOUNTER — Inpatient Hospital Stay (HOSPITAL_COMMUNITY)
Admission: EM | Admit: 2020-05-08 | Discharge: 2020-05-15 | DRG: 477 | Disposition: A | Discharge status: Hospice | Payer: Medicare Other | Attending: Internal Medicine | Admitting: Internal Medicine

## 2020-05-08 ENCOUNTER — Encounter (HOSPITAL_COMMUNITY): Payer: Self-pay | Admitting: Emergency Medicine

## 2020-05-08 DIAGNOSIS — R29810 Facial weakness: Secondary | ICD-10-CM | POA: Diagnosis present

## 2020-05-08 DIAGNOSIS — Z87898 Personal history of other specified conditions: Secondary | ICD-10-CM

## 2020-05-08 DIAGNOSIS — Z87891 Personal history of nicotine dependence: Secondary | ICD-10-CM | POA: Diagnosis not present

## 2020-05-08 DIAGNOSIS — R4182 Altered mental status, unspecified: Secondary | ICD-10-CM | POA: Diagnosis present

## 2020-05-08 DIAGNOSIS — G9341 Metabolic encephalopathy: Secondary | ICD-10-CM | POA: Diagnosis present

## 2020-05-08 DIAGNOSIS — W19XXXA Unspecified fall, initial encounter: Secondary | ICD-10-CM | POA: Diagnosis not present

## 2020-05-08 DIAGNOSIS — M47816 Spondylosis without myelopathy or radiculopathy, lumbar region: Secondary | ICD-10-CM | POA: Diagnosis present

## 2020-05-08 DIAGNOSIS — Z7982 Long term (current) use of aspirin: Secondary | ICD-10-CM

## 2020-05-08 DIAGNOSIS — S32010A Wedge compression fracture of first lumbar vertebra, initial encounter for closed fracture: Secondary | ICD-10-CM

## 2020-05-08 DIAGNOSIS — G40909 Epilepsy, unspecified, not intractable, without status epilepticus: Secondary | ICD-10-CM | POA: Diagnosis present

## 2020-05-08 DIAGNOSIS — Z955 Presence of coronary angioplasty implant and graft: Secondary | ICD-10-CM | POA: Diagnosis not present

## 2020-05-08 DIAGNOSIS — Z79899 Other long term (current) drug therapy: Secondary | ICD-10-CM | POA: Diagnosis not present

## 2020-05-08 DIAGNOSIS — R569 Unspecified convulsions: Secondary | ICD-10-CM | POA: Diagnosis not present

## 2020-05-08 DIAGNOSIS — E785 Hyperlipidemia, unspecified: Secondary | ICD-10-CM | POA: Diagnosis present

## 2020-05-08 DIAGNOSIS — R627 Adult failure to thrive: Secondary | ICD-10-CM | POA: Diagnosis present

## 2020-05-08 DIAGNOSIS — R296 Repeated falls: Secondary | ICD-10-CM | POA: Diagnosis present

## 2020-05-08 DIAGNOSIS — Z8571 Personal history of Hodgkin lymphoma: Secondary | ICD-10-CM | POA: Diagnosis not present

## 2020-05-08 DIAGNOSIS — Z515 Encounter for palliative care: Secondary | ICD-10-CM | POA: Diagnosis not present

## 2020-05-08 DIAGNOSIS — S32000A Wedge compression fracture of unspecified lumbar vertebra, initial encounter for closed fracture: Secondary | ICD-10-CM | POA: Diagnosis not present

## 2020-05-08 DIAGNOSIS — Z7189 Other specified counseling: Secondary | ICD-10-CM | POA: Diagnosis not present

## 2020-05-08 DIAGNOSIS — F419 Anxiety disorder, unspecified: Secondary | ICD-10-CM | POA: Diagnosis present

## 2020-05-08 DIAGNOSIS — Z66 Do not resuscitate: Secondary | ICD-10-CM | POA: Diagnosis present

## 2020-05-08 DIAGNOSIS — I2510 Atherosclerotic heart disease of native coronary artery without angina pectoris: Secondary | ICD-10-CM | POA: Diagnosis present

## 2020-05-08 DIAGNOSIS — N400 Enlarged prostate without lower urinary tract symptoms: Secondary | ICD-10-CM | POA: Diagnosis present

## 2020-05-08 DIAGNOSIS — R32 Unspecified urinary incontinence: Secondary | ICD-10-CM | POA: Diagnosis present

## 2020-05-08 DIAGNOSIS — Z9221 Personal history of antineoplastic chemotherapy: Secondary | ICD-10-CM | POA: Diagnosis not present

## 2020-05-08 DIAGNOSIS — M8008XA Age-related osteoporosis with current pathological fracture, vertebra(e), initial encounter for fracture: Secondary | ICD-10-CM | POA: Diagnosis present

## 2020-05-08 DIAGNOSIS — I10 Essential (primary) hypertension: Secondary | ICD-10-CM | POA: Diagnosis present

## 2020-05-08 DIAGNOSIS — Z20822 Contact with and (suspected) exposure to covid-19: Secondary | ICD-10-CM | POA: Diagnosis present

## 2020-05-08 DIAGNOSIS — K219 Gastro-esophageal reflux disease without esophagitis: Secondary | ICD-10-CM | POA: Diagnosis present

## 2020-05-08 DIAGNOSIS — R509 Fever, unspecified: Secondary | ICD-10-CM

## 2020-05-08 DIAGNOSIS — I252 Old myocardial infarction: Secondary | ICD-10-CM | POA: Diagnosis not present

## 2020-05-08 DIAGNOSIS — R531 Weakness: Secondary | ICD-10-CM | POA: Diagnosis present

## 2020-05-08 DIAGNOSIS — G4733 Obstructive sleep apnea (adult) (pediatric): Secondary | ICD-10-CM | POA: Diagnosis present

## 2020-05-08 LAB — URINALYSIS, ROUTINE W REFLEX MICROSCOPIC
Bacteria, UA: NONE SEEN
Bilirubin Urine: NEGATIVE
Glucose, UA: NEGATIVE mg/dL
Hgb urine dipstick: NEGATIVE
Ketones, ur: NEGATIVE mg/dL
Leukocytes,Ua: NEGATIVE
Nitrite: NEGATIVE
Protein, ur: 100 mg/dL — AB
Specific Gravity, Urine: 1.017 (ref 1.005–1.030)
pH: 6 (ref 5.0–8.0)

## 2020-05-08 LAB — COMPREHENSIVE METABOLIC PANEL
ALT: 13 U/L (ref 0–44)
AST: 22 U/L (ref 15–41)
Albumin: 3.6 g/dL (ref 3.5–5.0)
Alkaline Phosphatase: 42 U/L (ref 38–126)
Anion gap: 8 (ref 5–15)
BUN: 15 mg/dL (ref 8–23)
CO2: 23 mmol/L (ref 22–32)
Calcium: 9.3 mg/dL (ref 8.9–10.3)
Chloride: 104 mmol/L (ref 98–111)
Creatinine, Ser: 0.97 mg/dL (ref 0.61–1.24)
GFR, Estimated: >60 mL/min (ref >60)
Glucose, Bld: 115 mg/dL — ABNORMAL HIGH (ref 70–99)
Potassium: 4.3 mmol/L (ref 3.5–5.1)
Sodium: 135 mmol/L (ref 135–145)
Total Bilirubin: 0.8 mg/dL (ref 0.3–1.2)
Total Protein: 7.1 g/dL (ref 6.5–8.1)

## 2020-05-08 LAB — CBC
HCT: 45.4 % (ref 39.0–52.0)
Hemoglobin: 14.8 g/dL (ref 13.0–17.0)
MCH: 29.1 pg (ref 26.0–34.0)
MCHC: 32.6 g/dL (ref 30.0–36.0)
MCV: 89.4 fL (ref 80.0–100.0)
Platelets: 159 10*3/uL (ref 150–400)
RBC: 5.08 MIL/uL (ref 4.22–5.81)
RDW: 13.8 % (ref 11.5–15.5)
WBC: 8.7 10*3/uL (ref 4.0–10.5)
nRBC: 0 % (ref 0.0–0.2)

## 2020-05-08 MED ORDER — POLYETHYLENE GLYCOL 3350 17 G PO PACK: 17.0000 g | PACK | Freq: Every day | ORAL | Status: DC | PRN

## 2020-05-08 MED ORDER — EZETIMIBE 10 MG PO TABS
10.0000 mg | ORAL_TABLET | Freq: Every day | ORAL | Status: DC
  Administered 2020-05-09 – 2020-05-14 (×6): 10 mg via ORAL
  Filled 2020-05-08 (×6): qty 1

## 2020-05-08 MED ORDER — KETOROLAC TROMETHAMINE 15 MG/ML IJ SOLN
15.0000 mg | Freq: Four times a day (QID) | INTRAMUSCULAR | Status: AC | PRN
Start: 2020-05-08 — End: 2020-05-09

## 2020-05-08 MED ORDER — FINASTERIDE 5 MG PO TABS: 5.0000 mg | ORAL_TABLET | Freq: Every day | ORAL | Status: DC

## 2020-05-08 MED ORDER — LACTATED RINGERS IV SOLN: INTRAVENOUS | Status: DC

## 2020-05-08 MED ORDER — PANTOPRAZOLE SODIUM 40 MG PO TBEC
40.0000 mg | DELAYED_RELEASE_TABLET | Freq: Every day | ORAL | Status: DC
  Administered 2020-05-09 – 2020-05-14 (×6): 40 mg via ORAL
  Filled 2020-05-08 (×6): qty 1

## 2020-05-08 MED ORDER — GABAPENTIN 300 MG PO CAPS
600.0000 mg | ORAL_CAPSULE | Freq: Three times a day (TID) | ORAL | Status: DC
  Administered 2020-05-09 – 2020-05-14 (×18): 600 mg via ORAL
  Filled 2020-05-08 (×18): qty 2

## 2020-05-08 MED ORDER — MONTELUKAST SODIUM 10 MG PO TABS
10.0000 mg | ORAL_TABLET | Freq: Every day | ORAL | Status: DC
  Administered 2020-05-09 – 2020-05-14 (×7): 10 mg via ORAL
  Filled 2020-05-08 (×7): qty 1

## 2020-05-08 MED ORDER — LORATADINE 10 MG PO TABS
10.0000 mg | ORAL_TABLET | Freq: Every day | ORAL | Status: DC
  Administered 2020-05-09 – 2020-05-14 (×6): 10 mg via ORAL
  Filled 2020-05-08 (×6): qty 1

## 2020-05-08 MED ORDER — LATANOPROST 0.005 % OP SOLN
1.0000 [drp] | Freq: Every day | OPHTHALMIC | Status: DC
  Administered 2020-05-09 – 2020-05-14 (×7): 1 [drp] via OPHTHALMIC
  Filled 2020-05-08 (×2): qty 2.5

## 2020-05-08 MED ORDER — ACETAMINOPHEN 325 MG PO TABS
650.0000 mg | ORAL_TABLET | Freq: Four times a day (QID) | ORAL | Status: DC | PRN
  Administered 2020-05-12: 650 mg via ORAL
  Filled 2020-05-08: qty 2

## 2020-05-08 MED ORDER — ACETAMINOPHEN 325 MG PO TABS
650.0000 mg | ORAL_TABLET | Freq: Once | ORAL | Status: AC
  Administered 2020-05-08: 650 mg via ORAL
  Filled 2020-05-08: qty 2

## 2020-05-08 MED ORDER — ROSUVASTATIN CALCIUM 5 MG PO TABS
5.0000 mg | ORAL_TABLET | Freq: Every day | ORAL | Status: DC
  Administered 2020-05-10 – 2020-05-14 (×5): 5 mg via ORAL
  Filled 2020-05-08 (×5): qty 1

## 2020-05-08 MED ORDER — ISOSORBIDE MONONITRATE ER 60 MG PO TB24
60.0000 mg | ORAL_TABLET | Freq: Every day | ORAL | Status: DC
  Administered 2020-05-09 – 2020-05-14 (×6): 60 mg via ORAL
  Filled 2020-05-08: qty 2
  Filled 2020-05-08 (×5): qty 1

## 2020-05-08 MED ORDER — HYDROCODONE-ACETAMINOPHEN 5-325 MG PO TABS: 1.0000 | ORAL_TABLET | ORAL | Status: DC | PRN

## 2020-05-08 MED ORDER — LEVETIRACETAM 500 MG PO TABS
500.0000 mg | ORAL_TABLET | Freq: Two times a day (BID) | ORAL | Status: DC
  Administered 2020-05-09 – 2020-05-10 (×4): 500 mg via ORAL
  Filled 2020-05-08 (×4): qty 1

## 2020-05-08 MED ORDER — TAMSULOSIN HCL 0.4 MG PO CAPS
0.4000 mg | ORAL_CAPSULE | Freq: Every day | ORAL | Status: DC
  Administered 2020-05-09 – 2020-05-14 (×6): 0.4 mg via ORAL
  Filled 2020-05-08 (×6): qty 1

## 2020-05-08 MED ORDER — ENOXAPARIN SODIUM 40 MG/0.4ML IJ SOSY
40.0000 mg | PREFILLED_SYRINGE | INTRAMUSCULAR | Status: DC
  Administered 2020-05-09 – 2020-05-10 (×3): 40 mg via SUBCUTANEOUS
  Filled 2020-05-08 (×4): qty 0.4

## 2020-05-08 MED ORDER — CLONAZEPAM 1 MG PO TABS
1.0000 mg | ORAL_TABLET | Freq: Every day | ORAL | Status: DC
  Administered 2020-05-09 – 2020-05-14 (×7): 1 mg via ORAL
  Filled 2020-05-08 (×3): qty 1
  Filled 2020-05-08: qty 2
  Filled 2020-05-08 (×3): qty 1

## 2020-05-08 MED ORDER — METOPROLOL TARTRATE 25 MG PO TABS: 12.5000 mg | ORAL_TABLET | Freq: Two times a day (BID) | ORAL | Status: DC

## 2020-05-08 MED ORDER — ASPIRIN EC 81 MG PO TBEC
81.0000 mg | DELAYED_RELEASE_TABLET | Freq: Every day | ORAL | Status: DC
  Administered 2020-05-09 – 2020-05-14 (×6): 81 mg via ORAL
  Filled 2020-05-08 (×6): qty 1

## 2020-05-08 MED ORDER — DARIFENACIN HYDROBROMIDE ER 7.5 MG PO TB24
7.5000 mg | ORAL_TABLET | Freq: Every day | ORAL | Status: DC
  Administered 2020-05-09 – 2020-05-14 (×7): 7.5 mg via ORAL
  Filled 2020-05-08 (×8): qty 1

## 2020-05-08 NOTE — ED Triage Notes (Signed)
Pt here from home with c/o low back pain from 2 days ago , pt has had frequent falls over the the last 2 weeks

## 2020-05-08 NOTE — H&P (Signed)
History and Physical  Theodore Jones [id number] DOB: 1931/01/31 DOA: 05/08/2020  Referring physician: Dr. Ralene Bathe, [place]  PCP: Pcp, No  Outpatient Specialists: Neurology at [facility], pulmonology, GI. Patient coming from: Home lives with her son who is an ICU RN at [employer].  Chief Complaint: Confusion, low back pain, and recurrent falls.  HPI: Theodore Jones is a 87 y.o. male with medical history significant for OSA, on BiPAP followed by pulmonary, seizure disorder, previous MI, coronary artery disease, chronic constipation, essential hypertension, hyperlipidemia, GERD, who presented to [facility] ED from home due to worsening confusion, low back pain, and multiple falls.  Associated with generalized weakness.  Per his son he has had worsening cognitive decline in the last 2 weeks after his last seizure activity where he stares.  He has fallen a few times since then and has been minimally active due to his lower back pain.  History is mainly obtained from Bear, his son via phone, review of medical records as the patient is not able to provide a reliable history due to altered mental status.  Son has reported intolerance to narcotics.  Patient was brought into the ED for further evaluation and management.  Work-up in the ED unrevealing except for compression fracture involving L1 and L2.  TR[facility], hospitalist team, was asked to admit.  ED Course:  Afebrile.  BP 142/69, pulse 70, respiratory 20, O2 saturation 97% on room air.  Lab studies are essentially unremarkable. UA negative for pyuria.  Review of Systems: Review of systems as noted in the HPI. All other systems reviewed and are negative.   Past Medical History:  Diagnosis Date  . CAD (coronary artery disease)   . Esophageal reflux   . Heart disease   . History of PTCA   . Hodgkin lymphoma ([place])    chemo  . HTN (hypertension)   . Other and unspecified hyperlipidemia    Past Surgical History:  Procedure Laterality Date  .  APPENDECTOMY  1951  . CORONARY ANGIOPLASTY  2005  . SHOULDER SURGERY     repair  . TONSILLECTOMY  1940    Social History:  reports that he has quit smoking. His smoking use included cigarettes. He does not have any smokeless tobacco history on file. He reports current alcohol use. He reports that he does not use drugs.   No Known Allergies  Family History  Problem Relation Age of Onset  . Heart attack Father   . Coronary artery disease Father   . COPD Mother   . Cancer Mother   . Atrial fibrillation Brother   . Parkinsonism Brother   . Heart attack Other        family history      Prior to Admission medications   Medication Sig Start Date End Date Taking? Authorizing Provider  aspirin 81 MG tablet Take 81 mg by mouth daily.    [provider]  ezetimibe (ZETIA) 10 MG tablet Take 10 mg by mouth daily.    [provider]  finasteride (PROSCAR) 5 MG tablet Take 5 mg by mouth daily.    [provider]  GLUCOSAMINE PO Take by mouth.    [provider]  metoprolol (LOPRESSOR) 50 MG tablet Take 50 mg by mouth 2 (two) times daily.    [provider]  omeprazole (PRILOSEC) 20 MG capsule Take 20 mg by mouth daily.    [provider]  rosuvastatin (CRESTOR) 10 MG tablet Take 10 mg by mouth daily.  [provider]    Physical Exam: BP (!) 142/69   Pulse 70   Temp 98.6 F (37 C)   Resp 20   SpO2 97%   . General: 87 y.o. year-old male well developed well nourished in no acute distress.  Alert and oriented x1. . Cardiovascular: Regular rate and rhythm with no rubs or gallops.  No thyromegaly or JVD noted.  No lower extremity edema. 2/4 pulses in all 4 extremities. Marland Kitchen Respiratory: Clear to auscultation with no wheezes or rales. Good inspiratory effort. . Abdomen: Soft nontender nondistended with normal bowel sounds x4 quadrants. . Muskuloskeletal: No cyanosis, clubbing or edema noted bilaterally . Neuro: CN II-XII  intact, strength, sensation, reflexes . Skin: No ulcerative lesions noted or rashes . Psychiatry: Judgement and insight appear altered. Mood is appropriate for condition and setting          Labs on Admission:  Basic Metabolic Panel: Recent Labs  Lab 05/08/20 1129  NA 135  K 4.3  CL 104  CO2 23  GLUCOSE 115*  BUN 15  CREATININE 0.97  CALCIUM 9.3   Liver Function Tests: Recent Labs  Lab 05/08/20 1129  AST 22  ALT 13  ALKPHOS 42  BILITOT 0.8  PROT 7.1  ALBUMIN 3.6   No results for input(s): LIPASE, AMYLASE in the last 168 hours. No results for input(s): AMMONIA in the last 168 hours. CBC: Recent Labs  Lab 05/08/20 1129  WBC 8.7  HGB 14.8  HCT 45.4  MCV 89.4  PLT 159   Cardiac Enzymes: No results for input(s): CKTOTAL, CKMB, CKMBINDEX, TROPONINI in the last 168 hours.  BNP (last 3 results) No results for input(s): BNP in the last 8760 hours.  ProBNP (last 3 results) No results for input(s): PROBNP in the last 8760 hours.  CBG: No results for input(s): GLUCAP in the last 168 hours.  Radiological Exams on Admission: DG Lumbar Spine Complete  Result Date: 05/08/2020 CLINICAL DATA:  Back pain.  Multiple falls over the last 3 days. EXAM: LUMBAR SPINE - COMPLETE 4+ VIEW COMPARISON:  Report from 06/16/2015 FINDINGS: Aortoiliac atherosclerotic vascular disease. Reduced intervertebral disc height at L5-S1 indicative of degenerative disc disease. Degenerative facet arthropathy at L4-5 and L5-S1 bilaterally. No fracture or acute subluxation is identified. IMPRESSION: 1. No acute bony findings. 2. Degenerative disc disease at L5-S1. 3. Lower lumbar spondylosis particularly of the facet joints at L4-5 and L5-S1. 4.  Aortic Atherosclerosis (ICD10-I70.0). Electronically Signed   By: Van Clines M.D.   On: 05/08/2020 14:49   CT Head Wo Contrast  Result Date: 05/08/2020 CLINICAL DATA:  Dizziness. EXAM: CT HEAD WITHOUT CONTRAST TECHNIQUE: Contiguous axial images were  obtained from the base of the skull through the vertex without intravenous contrast. COMPARISON:  February 06, 2015 FINDINGS: Brain: There is mild cerebral atrophy with widening of the extra-axial spaces and ventricular dilatation. There are areas of decreased attenuation within the white matter tracts of the supratentorial brain, consistent with microvascular disease changes. A small, chronic left para thalamic and right basal ganglia lacunar infarcts are noted. Vascular: No hyperdense vessel or unexpected calcification. Skull: Normal. Negative for fracture or focal lesion. Sinuses/Orbits: No acute finding. Other: None. IMPRESSION: 1. Generalized cerebral atrophy. 2. No acute intracranial abnormality. Electronically Signed   By: Virgina Norfolk M.D.   On: 05/08/2020 18:29   MR THORACIC SPINE WO CONTRAST  Result Date: 05/08/2020 CLINICAL DATA:  Back injury 2 days ago. Frequent falls over the last 2 weeks. Mid  to low back pain. EXAM: MRI THORACIC AND LUMBAR SPINE WITHOUT CONTRAST TECHNIQUE: Multiplanar and multiecho pulse sequences of the thoracic and lumbar spine were obtained without intravenous contrast. COMPARISON:  Lumbar spine radiographs 05/08/2020. Cervical spine radiographs 01/29/2009 FINDINGS: MRI THORACIC SPINE FINDINGS Alignment:  Normal. Vertebrae: There is a chronic appearing T3 compression deformity with approximately 50% loss of vertebral body height and minimal retropulsion of the inferior endplate. There is no residual bone marrow edema. The posterior elements are intact. No acute fractures are seen within the thoracic spine. Cord: Normal in signal and caliber. Conus medullaris extends to the L2 level. Paraspinal and other soft tissues: No significant paraspinal findings. Small right greater than left dependent pleural effusions. Disc levels: As above, mild osseous retropulsion at T3-4 related to the chronic T3 compression deformity. No cord deformity or foraminal compromise. Mild disc bulging  and endplate osteophyte formation within the lower thoracic spine without significant disc herniation, spinal stenosis or nerve root encroachment. MRI LUMBAR SPINE FINDINGS Segmentation:  There are 5 lumbar type vertebral bodies. Alignment:  Physiologic. Vertebrae: There is superior endplate irregularity at L1 with associated linear low signal and surrounding marrow edema in the L1 vertebral body, suspicious for an acute compression deformity. There is also mild marrow edema within the anterior superior corner of the L2 vertebral body which could reflect an additional fracture or degenerative edema. The posterior elements are intact. There is no osseous retropulsion. Conus medullaris and cauda equina: Conus extends to the L2 level. Conus and cauda equina appear normal. Paraspinal and other soft tissues: Mild paraspinous edema anteriorly at L1, supporting an acute fracture. Disc levels: L1-2: Loss of disc height with mild disc bulging and facet hypertrophy. No spinal stenosis or nerve root encroachment. L2-3: No significant findings. L3-4: Mild loss of disc height with annular disc bulging and facet hypertrophy. No significant spinal stenosis or nerve root encroachment. L4-5: Mild loss of disc height with annular disc bulging, facet and ligamentous hypertrophy. Mild lateral recess and left foraminal narrowing without definite nerve root encroachment. L5-S1: Broad-based disc protrusion with covering spur in the right subarticular zone and medial foramen. This causes asymmetric narrowing of the right lateral recess and possible right S1 nerve root encroachment. In addition, there is mild right foraminal narrowing. Mild bilateral facet hypertrophy. IMPRESSION: 1. Probable acute mild superior endplate compression fracture at L1 with associated marrow edema. 2. Possible minimal superior endplate compression fracture at L2. 3. No acute findings in the thoracic spine. Old healed T3 compression deformity. 4. Lumbar  spondylosis as described with disc bulging, endplate osteophytes and facet hypertrophy. At L4-5, the lateral recesses and left foramen are mildly narrowed. At L5-S1, there is asymmetric right lateral recess and right foraminal narrowing. Electronically Signed   By: Richardean Sale M.D.   On: 05/08/2020 20:09   MR LUMBAR SPINE WO CONTRAST  Result Date: 05/08/2020 CLINICAL DATA:  Back injury 2 days ago. Frequent falls over the last 2 weeks. Mid to low back pain. EXAM: MRI THORACIC AND LUMBAR SPINE WITHOUT CONTRAST TECHNIQUE: Multiplanar and multiecho pulse sequences of the thoracic and lumbar spine were obtained without intravenous contrast. COMPARISON:  Lumbar spine radiographs 05/08/2020. Cervical spine radiographs 01/29/2009 FINDINGS: MRI THORACIC SPINE FINDINGS Alignment:  Normal. Vertebrae: There is a chronic appearing T3 compression deformity with approximately 50% loss of vertebral body height and minimal retropulsion of the inferior endplate. There is no residual bone marrow edema. The posterior elements are intact. No acute fractures are seen within the thoracic spine.  Cord: Normal in signal and caliber. Conus medullaris extends to the L2 level. Paraspinal and other soft tissues: No significant paraspinal findings. Small right greater than left dependent pleural effusions. Disc levels: As above, mild osseous retropulsion at T3-4 related to the chronic T3 compression deformity. No cord deformity or foraminal compromise. Mild disc bulging and endplate osteophyte formation within the lower thoracic spine without significant disc herniation, spinal stenosis or nerve root encroachment. MRI LUMBAR SPINE FINDINGS Segmentation:  There are 5 lumbar type vertebral bodies. Alignment:  Physiologic. Vertebrae: There is superior endplate irregularity at L1 with associated linear low signal and surrounding marrow edema in the L1 vertebral body, suspicious for an acute compression deformity. There is also mild marrow  edema within the anterior superior corner of the L2 vertebral body which could reflect an additional fracture or degenerative edema. The posterior elements are intact. There is no osseous retropulsion. Conus medullaris and cauda equina: Conus extends to the L2 level. Conus and cauda equina appear normal. Paraspinal and other soft tissues: Mild paraspinous edema anteriorly at L1, supporting an acute fracture. Disc levels: L1-2: Loss of disc height with mild disc bulging and facet hypertrophy. No spinal stenosis or nerve root encroachment. L2-3: No significant findings. L3-4: Mild loss of disc height with annular disc bulging and facet hypertrophy. No significant spinal stenosis or nerve root encroachment. L4-5: Mild loss of disc height with annular disc bulging, facet and ligamentous hypertrophy. Mild lateral recess and left foraminal narrowing without definite nerve root encroachment. L5-S1: Broad-based disc protrusion with covering spur in the right subarticular zone and medial foramen. This causes asymmetric narrowing of the right lateral recess and possible right S1 nerve root encroachment. In addition, there is mild right foraminal narrowing. Mild bilateral facet hypertrophy. IMPRESSION: 1. Probable acute mild superior endplate compression fracture at L1 with associated marrow edema. 2. Possible minimal superior endplate compression fracture at L2. 3. No acute findings in the thoracic spine. Old healed T3 compression deformity. 4. Lumbar spondylosis as described with disc bulging, endplate osteophytes and facet hypertrophy. At L4-5, the lateral recesses and left foramen are mildly narrowed. At L5-S1, there is asymmetric right lateral recess and right foraminal narrowing. Electronically Signed   By: Richardean Sale M.D.   On: 05/08/2020 20:09    EKG: I independently viewed the EKG done and my findings are as followed: Sinus rhythm rate of 80, first-degree AV block, frequent PVCs, RBBB, QTC  459.  Assessment/Plan Present on Admission: . AMS (altered mental status)  Active Problems:   AMS (altered mental status)  Acute metabolic encephalopathy, unclear etiology. Presented with worsening confusion and generalized weakness from home. Work-up thus far unrevealing CT head no evidence of acute intracranial findings. Urine analysis negative for pyuria. O2 saturation 97 to 99% on room air. No renal insufficiency, euvolemic on exam. Due to generalized weakness will obtain troponin S x2 EEG to rule out any active seizure activity Avoid narcotics. Start IV fluid hydration lactated Ringer at 50 cc/h x 2 days.  Lower back pain secondary to probable acute compression fracture of L1 and L2 seen on MRI lumbar spine 05/08/2020. IR consulted for possible vertebroplasty N.p.o. after midnight until seen by IR Per EDP son has advised not to give narcotics due to causing worsening mental status changes Alternate between Tylenol and IV Toradol as needed for pain  Frequent falls PT OT to assess Fall precautions TOC consulted to assist with home health needs or SNF placement  Seizure disorder On home Keppra 500 mg  twice daily Obtain Keppra level EEG to rule out any active seizure activity  Essential hypertension BP is currently not at goal of 130/80 Resume home oral antihypertensives. Monitor vital signs.  BPH Resume home finasteride Monitor urine output  Hyperlipidemia Resume home Zetia and Crestor  GERD Resume home PPI  Chronic anxiety Resume home Klonopin and gabapentin  OSA on BiPAP Resume BiPAP nightly  Ambulatory dysfunction with frequent falls PT OT For precautions    DVT prophylaxis: Subcu Lovenox daily.  Code Status: DNR per his son, medical POA, via phone.  Family Communication: Updated hiis son, [name], via phone who is also medical power of attorney.  He requests to be updated daily [phone].  Disposition Plan: Admit to telemetry  medical.  Consults called: IR consulted for possible vertebroplasty.  Admission status: Inpatient status.  Patient will require least 2 midnights for further evaluation and treatment of present condition.   Status is: Inpatient    Dispo:  Patient From: Home  Planned Disposition: Home with home health services versus SNF possibly on 05/10/2020, pending IR evaluation and PT OT recommendations.  Medically stable for discharge: No, ongoing management of lower back pain, acute metabolic encephalopathy of unclear etiology.         Kayleen Memos MD [facility] Pager [phone]  If 7PM-7AM, please contact night-coverage [email or web address] Password [facility]  05/08/2020, 8:43 PM

## 2020-05-08 NOTE — ED Notes (Signed)
Son states that if pt is going to be here well into the night, or be admitted, he would benefit from his Klonipin.   States he does not do well with Ativan as an [place].

## 2020-05-08 NOTE — ED Provider Notes (Signed)
[facility] EMERGENCY DEPARTMENT Provider Note   CSN: [id number] Arrival date & time: 05/08/20  1020     History No chief complaint on file.   Theodore Jones is a 87 y.o. male.  The history is provided by the patient and medical records.   Theodore Jones is a 87 y.o. male who presents to the Emergency Department complaining of back pain. He lives at home with his wife. He complains of one week of urinary incontinence as well as one week of mid back pain. His urinary incontinence is only at night. He did have a fall a couple of days ago. He did strike his head and one of the falls. No loss of consciousness. Now he is having difficulty with movement secondary to pain. No fevers, chest pain, abdominal pain, shortness of breath, nausea, vomiting. He denies any numbness, weakness. Low five caveat due to confusion.  Additional history available from patient's son after patient's initial ED assessment. Son states that over the last two weeks the patient has experienced a significant cognitive decline. He has experienced multiple falls over the last several days and now is unable to ambulate without assistance. He developed back pain after one of the falls. Patient lives at home with his wife and his son. Son feels that the patient is no longer safe at home.    Past Medical History:  Diagnosis Date  . CAD (coronary artery disease)   . Esophageal reflux   . Heart disease   . History of PTCA   . Hodgkin lymphoma ([place])    chemo  . HTN (hypertension)   . Other and unspecified hyperlipidemia     Patient Active Problem List   Diagnosis Date Noted  . AMS (altered mental status) 05/08/2020    Past Surgical History:  Procedure Laterality Date  . APPENDECTOMY  1951  . CORONARY ANGIOPLASTY  2005  . SHOULDER SURGERY     repair  . TONSILLECTOMY  1940       Family History  Problem Relation Age of Onset  . Heart attack Father   . Coronary artery disease Father   .  COPD Mother   . Cancer Mother   . Atrial fibrillation Brother   . Parkinsonism Brother   . Heart attack Other        family history    Social History   Tobacco Use  . Smoking status: Former Smoker    Types: Cigarettes  . Tobacco comment: quit around age 89  Substance Use Topics  . Alcohol use: Yes    Comment: occasionally  . Drug use: No    Home Medications Prior to Admission medications   Medication Sig Start Date End Date Taking? Authorizing Provider  aspirin 81 MG tablet Take 81 mg by mouth daily.    [provider]  ezetimibe (ZETIA) 10 MG tablet Take 10 mg by mouth daily.    [provider]  finasteride (PROSCAR) 5 MG tablet Take 5 mg by mouth daily.    [provider]  GLUCOSAMINE PO Take by mouth.    [provider]  metoprolol (LOPRESSOR) 50 MG tablet Take 50 mg by mouth 2 (two) times daily.    [provider]  omeprazole (PRILOSEC) 20 MG capsule Take 20 mg by mouth daily.    [provider]  rosuvastatin (CRESTOR) 10 MG tablet Take 10 mg by mouth daily.    [provider]    Allergies    Patient has no  known allergies.  Review of Systems   Review of Systems  All other systems reviewed and are negative.   Physical Exam Updated Vital Signs BP (!) 142/69   Pulse 70   Temp 98.6 F (37 C)   Resp 20   SpO2 97%   Physical Exam Vitals and nursing note reviewed.  Constitutional:      Appearance: He is well-developed.  HENT:     Head: Normocephalic and atraumatic.  Cardiovascular:     Rate and Rhythm: Normal rate and regular rhythm.     Heart sounds: No murmur heard.   Pulmonary:     Effort: Pulmonary effort is normal. No respiratory distress.     Breath sounds: Normal breath sounds.  Abdominal:     Palpations: Abdomen is soft.     Tenderness: There is no abdominal tenderness. There is no guarding or rebound.  Musculoskeletal:     Comments: Trace edema to bilateral lower extremities   Skin:    General: Skin is warm and dry.  Neurological:     Mental Status: He is alert and oriented to person, place, and time.     Comments: Five out of five strength and bilateral upper extremities, 4+ out of five strength and bilateral lower extremities. Sensation light touch intact in all four extremities. Unable to elicit patellar reflexes bilaterally. There is a resting tremor to the right upper extremity. Hard of hearing. Oriented to place. Disoriented to time.  Psychiatric:        Behavior: Behavior normal.     ED Results / Procedures / Treatments   Labs (all labs ordered are listed, but only abnormal results are displayed) Labs Reviewed  COMPREHENSIVE METABOLIC PANEL - Abnormal; Notable for the following components:      Result Value   Glucose, Bld 115 (*)    All other components within normal limits  URINALYSIS, ROUTINE W REFLEX MICROSCOPIC - Abnormal; Notable for the following components:   Protein, ur 100 (*)    All other components within normal limits  RESP PANEL BY RT-PCR (FLU A&B, COVID) [id number]  CBC  CBC  CREATININE, SERUM    EKG None  Radiology DG Lumbar Spine Complete  Result Date: 05/08/2020 CLINICAL DATA:  Back pain.  Multiple falls over the last 3 days. EXAM: LUMBAR SPINE - COMPLETE 4+ VIEW COMPARISON:  Report from 06/16/2015 FINDINGS: Aortoiliac atherosclerotic vascular disease. Reduced intervertebral disc height at L5-S1 indicative of degenerative disc disease. Degenerative facet arthropathy at L4-5 and L5-S1 bilaterally. No fracture or acute subluxation is identified. IMPRESSION: 1. No acute bony findings. 2. Degenerative disc disease at L5-S1. 3. Lower lumbar spondylosis particularly of the facet joints at L4-5 and L5-S1. 4.  Aortic Atherosclerosis (ICD10-I70.0). Electronically Signed   By: Van Clines M.D.   On: 05/08/2020 14:49   CT Head Wo Contrast  Result Date: 05/08/2020 CLINICAL DATA:  Dizziness. EXAM: CT HEAD WITHOUT CONTRAST TECHNIQUE:  Contiguous axial images were obtained from the base of the skull through the vertex without intravenous contrast. COMPARISON:  February 06, 2015 FINDINGS: Brain: There is mild cerebral atrophy with widening of the extra-axial spaces and ventricular dilatation. There are areas of decreased attenuation within the white matter tracts of the supratentorial brain, consistent with microvascular disease changes. A small, chronic left para thalamic and right basal ganglia lacunar infarcts are noted. Vascular: No hyperdense vessel or unexpected calcification. Skull: Normal. Negative for fracture or focal lesion. Sinuses/Orbits: No acute finding. Other: None. IMPRESSION: 1. Generalized cerebral atrophy. 2.  No acute intracranial abnormality. Electronically Signed   By: Aram Candela M.D.   On: 05/08/2020 18:29   MR THORACIC SPINE WO CONTRAST  Result Date: 05/08/2020 CLINICAL DATA:  Back injury 2 days ago. Frequent falls over the last 2 weeks. Mid to low back pain. EXAM: MRI THORACIC AND LUMBAR SPINE WITHOUT CONTRAST TECHNIQUE: Multiplanar and multiecho pulse sequences of the thoracic and lumbar spine were obtained without intravenous contrast. COMPARISON:  Lumbar spine radiographs 05/08/2020. Cervical spine radiographs 01/29/2009 FINDINGS: MRI THORACIC SPINE FINDINGS Alignment:  Normal. Vertebrae: There is a chronic appearing T3 compression deformity with approximately 50% loss of vertebral body height and minimal retropulsion of the inferior endplate. There is no residual bone marrow edema. The posterior elements are intact. No acute fractures are seen within the thoracic spine. Cord: Normal in signal and caliber. Conus medullaris extends to the L2 level. Paraspinal and other soft tissues: No significant paraspinal findings. Small right greater than left dependent pleural effusions. Disc levels: As above, mild osseous retropulsion at T3-4 related to the chronic T3 compression deformity. No cord deformity or foraminal  compromise. Mild disc bulging and endplate osteophyte formation within the lower thoracic spine without significant disc herniation, spinal stenosis or nerve root encroachment. MRI LUMBAR SPINE FINDINGS Segmentation:  There are 5 lumbar type vertebral bodies. Alignment:  Physiologic. Vertebrae: There is superior endplate irregularity at L1 with associated linear low signal and surrounding marrow edema in the L1 vertebral body, suspicious for an acute compression deformity. There is also mild marrow edema within the anterior superior corner of the L2 vertebral body which could reflect an additional fracture or degenerative edema. The posterior elements are intact. There is no osseous retropulsion. Conus medullaris and cauda equina: Conus extends to the L2 level. Conus and cauda equina appear normal. Paraspinal and other soft tissues: Mild paraspinous edema anteriorly at L1, supporting an acute fracture. Disc levels: L1-2: Loss of disc height with mild disc bulging and facet hypertrophy. No spinal stenosis or nerve root encroachment. L2-3: No significant findings. L3-4: Mild loss of disc height with annular disc bulging and facet hypertrophy. No significant spinal stenosis or nerve root encroachment. L4-5: Mild loss of disc height with annular disc bulging, facet and ligamentous hypertrophy. Mild lateral recess and left foraminal narrowing without definite nerve root encroachment. L5-S1: Broad-based disc protrusion with covering spur in the right subarticular zone and medial foramen. This causes asymmetric narrowing of the right lateral recess and possible right S1 nerve root encroachment. In addition, there is mild right foraminal narrowing. Mild bilateral facet hypertrophy. IMPRESSION: 1. Probable acute mild superior endplate compression fracture at L1 with associated marrow edema. 2. Possible minimal superior endplate compression fracture at L2. 3. No acute findings in the thoracic spine. Old healed T3 compression  deformity. 4. Lumbar spondylosis as described with disc bulging, endplate osteophytes and facet hypertrophy. At L4-5, the lateral recesses and left foramen are mildly narrowed. At L5-S1, there is asymmetric right lateral recess and right foraminal narrowing. Electronically Signed   By: Carey Bullocks M.D.   On: 05/08/2020 20:09   MR LUMBAR SPINE WO CONTRAST  Result Date: 05/08/2020 CLINICAL DATA:  Back injury 2 days ago. Frequent falls over the last 2 weeks. Mid to low back pain. EXAM: MRI THORACIC AND LUMBAR SPINE WITHOUT CONTRAST TECHNIQUE: Multiplanar and multiecho pulse sequences of the thoracic and lumbar spine were obtained without intravenous contrast. COMPARISON:  Lumbar spine radiographs 05/08/2020. Cervical spine radiographs 01/29/2009 FINDINGS: MRI THORACIC SPINE FINDINGS Alignment:  Normal.  Vertebrae: There is a chronic appearing T3 compression deformity with approximately 50% loss of vertebral body height and minimal retropulsion of the inferior endplate. There is no residual bone marrow edema. The posterior elements are intact. No acute fractures are seen within the thoracic spine. Cord: Normal in signal and caliber. Conus medullaris extends to the L2 level. Paraspinal and other soft tissues: No significant paraspinal findings. Small right greater than left dependent pleural effusions. Disc levels: As above, mild osseous retropulsion at T3-4 related to the chronic T3 compression deformity. No cord deformity or foraminal compromise. Mild disc bulging and endplate osteophyte formation within the lower thoracic spine without significant disc herniation, spinal stenosis or nerve root encroachment. MRI LUMBAR SPINE FINDINGS Segmentation:  There are 5 lumbar type vertebral bodies. Alignment:  Physiologic. Vertebrae: There is superior endplate irregularity at L1 with associated linear low signal and surrounding marrow edema in the L1 vertebral body, suspicious for an acute compression deformity. There is  also mild marrow edema within the anterior superior corner of the L2 vertebral body which could reflect an additional fracture or degenerative edema. The posterior elements are intact. There is no osseous retropulsion. Conus medullaris and cauda equina: Conus extends to the L2 level. Conus and cauda equina appear normal. Paraspinal and other soft tissues: Mild paraspinous edema anteriorly at L1, supporting an acute fracture. Disc levels: L1-2: Loss of disc height with mild disc bulging and facet hypertrophy. No spinal stenosis or nerve root encroachment. L2-3: No significant findings. L3-4: Mild loss of disc height with annular disc bulging and facet hypertrophy. No significant spinal stenosis or nerve root encroachment. L4-5: Mild loss of disc height with annular disc bulging, facet and ligamentous hypertrophy. Mild lateral recess and left foraminal narrowing without definite nerve root encroachment. L5-S1: Broad-based disc protrusion with covering spur in the right subarticular zone and medial foramen. This causes asymmetric narrowing of the right lateral recess and possible right S1 nerve root encroachment. In addition, there is mild right foraminal narrowing. Mild bilateral facet hypertrophy. IMPRESSION: 1. Probable acute mild superior endplate compression fracture at L1 with associated marrow edema. 2. Possible minimal superior endplate compression fracture at L2. 3. No acute findings in the thoracic spine. Old healed T3 compression deformity. 4. Lumbar spondylosis as described with disc bulging, endplate osteophytes and facet hypertrophy. At L4-5, the lateral recesses and left foramen are mildly narrowed. At L5-S1, there is asymmetric right lateral recess and right foraminal narrowing. Electronically Signed   By: Richardean Sale M.D.   On: 05/08/2020 20:09    Procedures Procedures   Medications Ordered in ED Medications  enoxaparin (LOVENOX) injection 40 mg (has no administration in time range)   aspirin tablet 81 mg (has no administration in time range)  ezetimibe (ZETIA) tablet 10 mg (has no administration in time range)  finasteride (PROSCAR) tablet 5 mg (has no administration in time range)  metoprolol tartrate (LOPRESSOR) tablet 12.5 mg (has no administration in time range)  pantoprazole (PROTONIX) EC tablet 40 mg (has no administration in time range)  rosuvastatin (CRESTOR) tablet 10 mg (has no administration in time range)  acetaminophen (TYLENOL) tablet 650 mg (has no administration in time range)  polyethylene glycol (MIRALAX / GLYCOLAX) packet 17 g (has no administration in time range)  acetaminophen (TYLENOL) tablet 650 mg (650 mg Oral Given 05/08/20 1906)    ED Course  I have reviewed the triage vital signs and the nursing notes.  Pertinent labs & imaging results that were available during my care of  the patient were reviewed by me and considered in my medical decision making (see chart for details).    MDM Rules/Calculators/A&P                         patient here for evaluation following multiple falls with back pain and worsening confusion. MRI with L1 compression fracture. Given patient's ongoing pain hospitalist consulted for admission for ongoing treatment.  Final Clinical Impression(s) / ED Diagnoses Final diagnoses:  Fall, initial encounter  Closed compression fracture of L1 vertebra, initial encounter [facility])    Rx / [facility] Orders ED Discharge Orders    None       Quintella Reichert, MD 05/08/20 2054

## 2020-05-09 ENCOUNTER — Inpatient Hospital Stay (HOSPITAL_COMMUNITY): Payer: Medicare Other

## 2020-05-09 ENCOUNTER — Other Ambulatory Visit: Payer: Self-pay

## 2020-05-09 DIAGNOSIS — S32000A Wedge compression fracture of unspecified lumbar vertebra, initial encounter for closed fracture: Secondary | ICD-10-CM

## 2020-05-09 DIAGNOSIS — R569 Unspecified convulsions: Secondary | ICD-10-CM

## 2020-05-09 DIAGNOSIS — R4182 Altered mental status, unspecified: Secondary | ICD-10-CM

## 2020-05-09 LAB — RESP PANEL BY RT-PCR (FLU A&B, COVID) ARPGX2
Influenza A by PCR: NEGATIVE
Influenza B by PCR: NEGATIVE
SARS Coronavirus 2 by RT PCR: NEGATIVE

## 2020-05-09 LAB — BASIC METABOLIC PANEL
Anion gap: 6 (ref 5–15)
BUN: 13 mg/dL (ref 8–23)
CO2: 26 mmol/L (ref 22–32)
Calcium: 8.8 mg/dL — ABNORMAL LOW (ref 8.9–10.3)
Chloride: 106 mmol/L (ref 98–111)
Creatinine, Ser: 0.87 mg/dL (ref 0.61–1.24)
GFR, Estimated: >60 mL/min (ref >60)
Glucose, Bld: 112 mg/dL — ABNORMAL HIGH (ref 70–99)
Potassium: 3.8 mmol/L (ref 3.5–5.1)
Sodium: 138 mmol/L (ref 135–145)

## 2020-05-09 LAB — PHOSPHORUS: Phosphorus: 3.6 mg/dL (ref 2.5–4.6)

## 2020-05-09 LAB — CBC
HCT: 43.7 % (ref 39.0–52.0)
Hemoglobin: 14.1 g/dL (ref 13.0–17.0)
MCHC: 32.3 g/dL (ref 30.0–36.0)
MCV: 90.5 fL (ref 80.0–100.0)
RBC: 4.83 MIL/uL (ref 4.22–5.81)
RDW: 13.9 % (ref 11.5–15.5)
WBC: 6.9 10*3/uL (ref 4.0–10.5)
nRBC: 0 % (ref 0.0–0.2)

## 2020-05-09 LAB — TROPONIN I (HIGH SENSITIVITY): Troponin I (High Sensitivity): 34 ng/L — ABNORMAL HIGH (ref <18)

## 2020-05-09 LAB — MAGNESIUM: Magnesium: 2.1 mg/dL (ref 1.7–2.4)

## 2020-05-09 MED ORDER — SODIUM CHLORIDE 0.9% FLUSH
10.0000 mL | Freq: Two times a day (BID) | INTRAVENOUS | Status: DC
  Administered 2020-05-12 – 2020-05-13 (×3): 10 mL

## 2020-05-09 MED ORDER — SODIUM CHLORIDE 0.9% FLUSH: 10.0000 mL | INTRAVENOUS | Status: DC | PRN

## 2020-05-09 NOTE — Procedures (Signed)
Patient Name: Theodore Jones  MRN: [id number]  Epilepsy Attending: Lora Havens  Referring Physician/Provider: Dr Irene Pap Date: 05/09/2020 Duration: 28.29 mins  Patient history: 87yo M with ams. EEG to evaluate for seizure  Level of alertness: Awake  AEDs during EEG study: LEV  Technical aspects: This EEG study was done with scalp electrodes positioned according to the 10-20 International system of electrode placement. Electrical activity was acquired at a sampling rate of 500Hz  and reviewed with a high frequency filter of 70Hz  and a low frequency filter of 1Hz . EEG data were recorded continuously and digitally stored.   Description: The posterior dominant rhythm consists of 8 Hz activity of moderate voltage (25-35 uV) seen predominantly in posterior head regions, symmetric and reactive to eye opening and eye closing.  EEG showed intermittent generalized 3 to 6 Hz theta-delta slowing. Hyperventilation and photic stimulation were not performed.     ABNORMALITY - Intermittent slow, generalized  IMPRESSION: This study is suggestive of mild diffuse encephalopathy, nonspecific etiology. No seizures or epileptiform discharges were seen throughout the recording.  Shadee Montoya Barbra Sarks

## 2020-05-09 NOTE — Progress Notes (Signed)
Patient ID: Theodore Jones, male   DOB: 1931/02/05, 87 y.o.   MRN: [id number]  PROGRESS NOTE    Theodore Jones  QIO:[id number] DOB: 10/26/1931 DOA: 05/08/2020 PCP: Pcp, No   Brief Narrative:  87 y.o. male with medical history significant for OSA, on BiPAP followed by pulmonary, seizure disorder, previous MI, coronary artery disease, chronic constipation, essential hypertension, hyperlipidemia, GERD, who presented to [facility] ED from home due to worsening confusion, low back pain, and multiple falls along with generalized weakness and cognitive decline over the last 2 weeks.  On presentation, he was afebrile and lab studies were essentially unremarkable.  UA was negative for pyuria.  He was found to have compression fracture involving L1 and L2.  Assessment & Plan:   Acute metabolic encephalopathy, unclear etiology -Presented with worsening confusion and generalized weakness from home.  Work-up unrevealing so far including UA negative for pyuria and CT head negative for acute intracranial findings. -EEG pending. -Continue gentle hydration.  Avoid narcotics.  Check vitamin L24, TSH, folic acid and ammonia levels in a.m. -Monitor mental status.  Fall precautions.  If mental status does not improve, will need MRI of brain.  Spoke to son at bedside who is hesitant about MRI of brain since it would not alter the management.  Son states that mental status has progressively worsened over the last few weeks after a seizure episode. Will request neurology evaluation.  Acute compression fracture of L1 and L2 seen on MRI of lumbar spine presenting with lower back pain -IR consulted for vertebroplasty.  N.p.o. for now.  Pain management.  Fall precautions  Frequent falls -PT/OT eval.  Might need placement.  TOC consult  Seizure disorder -Continue Keppra.  Follow EEG.  Essential hypertension -Blood pressure slightly elevated.  Continue isosorbide mononitrate.  Hyperlipidemia -Continue ezetimibe and  rosuvastatin  BPH -Continue tamsulosin  GERD -Continue PPI  OSA on BiPAP  -resume BiPAP nightly  Chronic anxiety  -Resume home Klonopin and gabapentin.  Might have to decrease dose of gabapentin  Ambulatory dysfunction with frequent falls -PT/OT eval   DVT prophylaxis: Lovenox Code Status: DNR Family Communication: Son/Eric at bedside Disposition Plan: Status is: Inpatient  Remains inpatient appropriate because:Inpatient level of care appropriate due to severity of illness   Dispo:  Patient From: Home  Planned Disposition: Home  Medically stable for discharge: No    Consultants: IR  Procedures: None  Antimicrobials: None   Subjective: Patient seen and examined at bedside undergoing EEG.  Poor historian, hard of hearing.  No overnight fever, vomiting, seizures reported.  Objective: Vitals:   05/09/20 0500 05/09/20 0630 05/09/20 0700 05/09/20 0720  BP: (!) 160/66  (!) 152/93 (!) 153/92  Pulse:    79  Resp:  18    Temp:  98.3 F (36.8 C)    TempSrc:  Oral    SpO2: 97% 96% 96% 96%   No intake or output data in the 24 hours ending 05/09/20 0825 There were no vitals filed for this visit.  Examination:  General exam: Appears calm and comfortable.  Elderly male lying in bed.  Currently on room air. Respiratory system: Bilateral decreased breath sounds at bases Cardiovascular system: S1 & S2 heard, Rate controlled Gastrointestinal system: Abdomen is nondistended, soft and nontender. Normal bowel sounds heard. Extremities: No cyanosis, clubbing, edema  Central nervous system: Awake, extremely hard of hearing.  Extremely poor historian.  No focal neurological deficits. Moving extremities Skin: No rashes, lesions or ulcers Psychiatry: Flat affect.  Does not participate  in conversation much   Data Reviewed: I have personally reviewed following labs and imaging studies  CBC: Recent Labs  Lab 05/08/20 1129 05/09/20 0315  WBC 8.7 6.9  HGB 14.8 14.1  HCT  45.4 43.7  MCV 89.4 90.5  PLT 159 323   Basic Metabolic Panel: Recent Labs  Lab 05/08/20 1129 05/09/20 0315  NA 135 138  K 4.3 3.8  CL 104 106  CO2 23 26  GLUCOSE 115* 112*  BUN 15 13  CREATININE 0.97 0.87  CALCIUM 9.3 8.8*  MG  --  2.1  PHOS  --  3.6   GFR: CrCl cannot be calculated (Unknown ideal weight.). Liver Function Tests: Recent Labs  Lab 05/08/20 1129  AST 22  ALT 13  ALKPHOS 42  BILITOT 0.8  PROT 7.1  ALBUMIN 3.6   No results for input(s): LIPASE, AMYLASE in the last 168 hours. No results for input(s): AMMONIA in the last 168 hours. Coagulation Profile: No results for input(s): INR, PROTIME in the last 168 hours. Cardiac Enzymes: No results for input(s): CKTOTAL, CKMB, CKMBINDEX, TROPONINI in the last 168 hours. BNP (last 3 results) No results for input(s): PROBNP in the last 8760 hours. HbA1C: No results for input(s): HGBA1C in the last 72 hours. CBG: No results for input(s): GLUCAP in the last 168 hours. Lipid Profile: No results for input(s): CHOL, HDL, LDLCALC, TRIG, CHOLHDL, LDLDIRECT in the last 72 hours. Thyroid Function Tests: No results for input(s): TSH, T4TOTAL, FREET4, T3FREE, THYROIDAB in the last 72 hours. Anemia Panel: No results for input(s): VITAMINB12, FOLATE, FERRITIN, TIBC, IRON, RETICCTPCT in the last 72 hours. Sepsis Labs: No results for input(s): PROCALCITON, LATICACIDVEN in the last 168 hours.  Recent Results (from the past 240 hour(s))  Resp Panel by RT-PCR (Flu A&B, Covid) Nasopharyngeal Swab     Status: None   Collection Time: 05/08/20 10:46 PM   Specimen: Nasopharyngeal Swab; Nasopharyngeal(NP) swabs in vial transport medium  Result Value Ref Range Status   SARS Coronavirus 2 by RT PCR NEGATIVE NEGATIVE Final    Comment: (NOTE) SARS-CoV-2 target nucleic acids are NOT DETECTED.  The SARS-CoV-2 RNA is generally detectable in upper respiratory specimens during the acute phase of infection. The lowest concentration of  SARS-CoV-2 viral copies this assay can detect is 138 copies/mL. A negative result does not preclude SARS-Cov-2 infection and should not be used as the sole basis for treatment or other patient management decisions. A negative result may occur with  improper specimen collection/handling, submission of specimen other than nasopharyngeal swab, presence of viral mutation(s) within the areas targeted by this assay, and inadequate number of viral copies(<138 copies/mL). A negative result must be combined with clinical observations, patient history, and epidemiological information. The expected result is Negative.  Fact Sheet for Patients:  EntrepreneurPulse.com.au  Fact Sheet for Healthcare Providers:  IncredibleEmployment.be  This test is no t yet approved or cleared by the [place] FDA and  has been authorized for detection and/or diagnosis of SARS-CoV-2 by FDA under an Emergency Use Authorization (EUA). This EUA will remain  in effect (meaning this test can be used) for the duration of the COVID-19 declaration under Section 564(b)(1) of the Act, 21 U.S.C.section 360bbb-3(b)(1), unless the authorization is terminated  or revoked sooner.       Influenza A by PCR NEGATIVE NEGATIVE Final   Influenza B by PCR NEGATIVE NEGATIVE Final    Comment: (NOTE) The Xpert Xpress SARS-CoV-2/FLU/RSV plus assay is intended as an aid in the  diagnosis of influenza from Nasopharyngeal swab specimens and should not be used as a sole basis for treatment. Nasal washings and aspirates are unacceptable for Xpert Xpress SARS-CoV-2/FLU/RSV testing.  Fact Sheet for Patients: EntrepreneurPulse.com.au  Fact Sheet for Healthcare Providers: IncredibleEmployment.be  This test is not yet approved or cleared by the [place] FDA and has been authorized for detection and/or diagnosis of SARS-CoV-2 by FDA under an Emergency Use  Authorization (EUA). This EUA will remain in effect (meaning this test can be used) for the duration of the COVID-19 declaration under Section 564(b)(1) of the Act, 21 U.S.C. section 360bbb-3(b)(1), unless the authorization is terminated or revoked.  Performed at [facility], [place] [address], [place], [place] [address]          Radiology Studies: DG Lumbar Spine Complete  Result Date: 05/08/2020 CLINICAL DATA:  Back pain.  Multiple falls over the last 3 days. EXAM: LUMBAR SPINE - COMPLETE 4+ VIEW COMPARISON:  Report from 06/16/2015 FINDINGS: Aortoiliac atherosclerotic vascular disease. Reduced intervertebral disc height at L5-S1 indicative of degenerative disc disease. Degenerative facet arthropathy at L4-5 and L5-S1 bilaterally. No fracture or acute subluxation is identified. IMPRESSION: 1. No acute bony findings. 2. Degenerative disc disease at L5-S1. 3. Lower lumbar spondylosis particularly of the facet joints at L4-5 and L5-S1. 4.  Aortic Atherosclerosis (ICD10-I70.0). Electronically Signed   By: Van Clines M.D.   On: 05/08/2020 14:49   CT Head Wo Contrast  Result Date: 05/08/2020 CLINICAL DATA:  Dizziness. EXAM: CT HEAD WITHOUT CONTRAST TECHNIQUE: Contiguous axial images were obtained from the base of the skull through the vertex without intravenous contrast. COMPARISON:  February 06, 2015 FINDINGS: Brain: There is mild cerebral atrophy with widening of the extra-axial spaces and ventricular dilatation. There are areas of decreased attenuation within the white matter tracts of the supratentorial brain, consistent with microvascular disease changes. A small, chronic left para thalamic and right basal ganglia lacunar infarcts are noted. Vascular: No hyperdense vessel or unexpected calcification. Skull: Normal. Negative for fracture or focal lesion. Sinuses/Orbits: No acute finding. Other: None. IMPRESSION: 1. Generalized cerebral atrophy. 2. No acute intracranial abnormality.  Electronically Signed   By: Virgina Norfolk M.D.   On: 05/08/2020 18:29   MR THORACIC SPINE WO CONTRAST  Result Date: 05/08/2020 CLINICAL DATA:  Back injury 2 days ago. Frequent falls over the last 2 weeks. Mid to low back pain. EXAM: MRI THORACIC AND LUMBAR SPINE WITHOUT CONTRAST TECHNIQUE: Multiplanar and multiecho pulse sequences of the thoracic and lumbar spine were obtained without intravenous contrast. COMPARISON:  Lumbar spine radiographs 05/08/2020. Cervical spine radiographs 01/29/2009 FINDINGS: MRI THORACIC SPINE FINDINGS Alignment:  Normal. Vertebrae: There is a chronic appearing T3 compression deformity with approximately 50% loss of vertebral body height and minimal retropulsion of the inferior endplate. There is no residual bone marrow edema. The posterior elements are intact. No acute fractures are seen within the thoracic spine. Cord: Normal in signal and caliber. Conus medullaris extends to the L2 level. Paraspinal and other soft tissues: No significant paraspinal findings. Small right greater than left dependent pleural effusions. Disc levels: As above, mild osseous retropulsion at T3-4 related to the chronic T3 compression deformity. No cord deformity or foraminal compromise. Mild disc bulging and endplate osteophyte formation within the lower thoracic spine without significant disc herniation, spinal stenosis or nerve root encroachment. MRI LUMBAR SPINE FINDINGS Segmentation:  There are 5 lumbar type vertebral bodies. Alignment:  Physiologic. Vertebrae: There is superior endplate irregularity at L1 with  associated linear low signal and surrounding marrow edema in the L1 vertebral body, suspicious for an acute compression deformity. There is also mild marrow edema within the anterior superior corner of the L2 vertebral body which could reflect an additional fracture or degenerative edema. The posterior elements are intact. There is no osseous retropulsion. Conus medullaris and cauda equina:  Conus extends to the L2 level. Conus and cauda equina appear normal. Paraspinal and other soft tissues: Mild paraspinous edema anteriorly at L1, supporting an acute fracture. Disc levels: L1-2: Loss of disc height with mild disc bulging and facet hypertrophy. No spinal stenosis or nerve root encroachment. L2-3: No significant findings. L3-4: Mild loss of disc height with annular disc bulging and facet hypertrophy. No significant spinal stenosis or nerve root encroachment. L4-5: Mild loss of disc height with annular disc bulging, facet and ligamentous hypertrophy. Mild lateral recess and left foraminal narrowing without definite nerve root encroachment. L5-S1: Broad-based disc protrusion with covering spur in the right subarticular zone and medial foramen. This causes asymmetric narrowing of the right lateral recess and possible right S1 nerve root encroachment. In addition, there is mild right foraminal narrowing. Mild bilateral facet hypertrophy. IMPRESSION: 1. Probable acute mild superior endplate compression fracture at L1 with associated marrow edema. 2. Possible minimal superior endplate compression fracture at L2. 3. No acute findings in the thoracic spine. Old healed T3 compression deformity. 4. Lumbar spondylosis as described with disc bulging, endplate osteophytes and facet hypertrophy. At L4-5, the lateral recesses and left foramen are mildly narrowed. At L5-S1, there is asymmetric right lateral recess and right foraminal narrowing. Electronically Signed   By: Richardean Sale M.D.   On: 05/08/2020 20:09   MR LUMBAR SPINE WO CONTRAST  Result Date: 05/08/2020 CLINICAL DATA:  Back injury 2 days ago. Frequent falls over the last 2 weeks. Mid to low back pain. EXAM: MRI THORACIC AND LUMBAR SPINE WITHOUT CONTRAST TECHNIQUE: Multiplanar and multiecho pulse sequences of the thoracic and lumbar spine were obtained without intravenous contrast. COMPARISON:  Lumbar spine radiographs 05/08/2020. Cervical spine  radiographs 01/29/2009 FINDINGS: MRI THORACIC SPINE FINDINGS Alignment:  Normal. Vertebrae: There is a chronic appearing T3 compression deformity with approximately 50% loss of vertebral body height and minimal retropulsion of the inferior endplate. There is no residual bone marrow edema. The posterior elements are intact. No acute fractures are seen within the thoracic spine. Cord: Normal in signal and caliber. Conus medullaris extends to the L2 level. Paraspinal and other soft tissues: No significant paraspinal findings. Small right greater than left dependent pleural effusions. Disc levels: As above, mild osseous retropulsion at T3-4 related to the chronic T3 compression deformity. No cord deformity or foraminal compromise. Mild disc bulging and endplate osteophyte formation within the lower thoracic spine without significant disc herniation, spinal stenosis or nerve root encroachment. MRI LUMBAR SPINE FINDINGS Segmentation:  There are 5 lumbar type vertebral bodies. Alignment:  Physiologic. Vertebrae: There is superior endplate irregularity at L1 with associated linear low signal and surrounding marrow edema in the L1 vertebral body, suspicious for an acute compression deformity. There is also mild marrow edema within the anterior superior corner of the L2 vertebral body which could reflect an additional fracture or degenerative edema. The posterior elements are intact. There is no osseous retropulsion. Conus medullaris and cauda equina: Conus extends to the L2 level. Conus and cauda equina appear normal. Paraspinal and other soft tissues: Mild paraspinous edema anteriorly at L1, supporting an acute fracture. Disc levels: L1-2: Loss of disc  height with mild disc bulging and facet hypertrophy. No spinal stenosis or nerve root encroachment. L2-3: No significant findings. L3-4: Mild loss of disc height with annular disc bulging and facet hypertrophy. No significant spinal stenosis or nerve root encroachment. L4-5:  Mild loss of disc height with annular disc bulging, facet and ligamentous hypertrophy. Mild lateral recess and left foraminal narrowing without definite nerve root encroachment. L5-S1: Broad-based disc protrusion with covering spur in the right subarticular zone and medial foramen. This causes asymmetric narrowing of the right lateral recess and possible right S1 nerve root encroachment. In addition, there is mild right foraminal narrowing. Mild bilateral facet hypertrophy. IMPRESSION: 1. Probable acute mild superior endplate compression fracture at L1 with associated marrow edema. 2. Possible minimal superior endplate compression fracture at L2. 3. No acute findings in the thoracic spine. Old healed T3 compression deformity. 4. Lumbar spondylosis as described with disc bulging, endplate osteophytes and facet hypertrophy. At L4-5, the lateral recesses and left foramen are mildly narrowed. At L5-S1, there is asymmetric right lateral recess and right foraminal narrowing. Electronically Signed   By: Richardean Sale M.D.   On: 05/08/2020 20:09        Scheduled Meds: . aspirin EC  81 mg Oral Daily  . clonazePAM  1 mg Oral QHS  . darifenacin  7.5 mg Oral Daily  . enoxaparin (LOVENOX) injection  40 mg Subcutaneous Q24H  . ezetimibe  10 mg Oral Daily  . gabapentin  600 mg Oral TID  . isosorbide mononitrate  60 mg Oral Daily  . latanoprost  1 drop Both Eyes QHS  . levETIRAcetam  500 mg Oral BID  . loratadine  10 mg Oral Daily  . montelukast  10 mg Oral Daily  . pantoprazole  40 mg Oral Daily  . rosuvastatin  5 mg Oral Daily  . tamsulosin  0.4 mg Oral Daily   Continuous Infusions: . lactated ringers 50 mL/hr at 05/09/20 0341          Aline August, MD [facility] 05/09/2020, 8:25 AM

## 2020-05-09 NOTE — Progress Notes (Signed)
EEG complete - results pending 

## 2020-05-09 NOTE — Consult Note (Addendum)
Neurology Consultation  Reason for Consult: Progressive cognitive decline following seizure 2 weeks PTA  Referring Physician: Dr. Starla Link  CC: Lower back pain, progressive confusion, frequent falls.  History is obtained from: Chart Review, Patient's Son via telephone  HPI: Theodore Jones is a 87 y.o. male with a medical history significant for seizure disorder, hypertension, coronary artery disease, right ICA stent placed in February 2018, hyperlipidemia, remote MI, Hodgkin lymphoma s/p chemotherapy, and GERD who presented to the ED from home on 5/6 for evaluation of progressive confusion, low back pain, multiple falls, and generalized weakness since last seizure that occurred approximately 2 weeks prior to current hospitalization. He also endorses one week of nocturnal urinary incontinence. He does endorse striking his head during one of his falls over the past 2 weeks and increased low back pain with lower extremity movement. Per patient's son, Theodore Jones has experienced a significant cognitive decline over the past 2 weeks with frequent falls limiting his mobility to ambulate without assistance. His son states that his seizure activity 2 weeks ago involved behavioral arrest and staring off while documented history of seizures involves posturing and rhythmic movements of the right upper extremity with facial drooping, confusion, speech abnormalities, and contractures of bilateral upper extremities. Initial imaging revealed acute superior endplate compression fractures at L1 - L2 with associated marrow edema at L1.   On chart review per Dr. Lynnette Caffey per note on 01/13/2020 regarding history of seizures: "EEG performed on 02/10/2011 indicated moderate diffuse slowing with prominent triphasic waves. No seizures were noted during this recording. ... He has been followed by Dr. Blenda Nicely who maintained him on [place] for a while as it was unclear if his initial symptoms were more related to TIA or a possible  seizure. Ultimately he was doing well on the Keppra and this was tapered off. However after stopping this, patient ended up having an episode of shaking of the right upper extremity that was associated with confusion, speech abnormalities and contractures of both upper extremities. Per family, the events are very similar each time. At times, the patient states that he is moving his hands on purpose, but it's unclear if this is more of a stereotyped purposeful movement or seizure activity. In June 2017, he had multiple seizure-like events. He was noted to have posturing and rhythmic movements of the right upper extremity with facial drooping. Comprehension and language ability were also affected. Patient had been on Keppra 750 mg twice a day. MRI demonstrated no restricted diffusion on the diffusion-weighted imaging. Stable FLAIR periventricular white matter hyperintensities again demonstrated. MRA of the brain unremarkable. Following the most recent visit Keppra was increased from 750 mg to 1000 mg twice a day. Routine EEG performed 06/02/2015 was normal. As he been having some problems with fatigue and irritability with increased dose of Keppra, it was decided that he would transition to Depakote. Unfortunately, the patient began having significant ataxia and confusion even one started on a low-dose of Depakote. Eventually, the medication side effects did seem to stabilize and he denies any side effects from the Depakote. However, Family felt like the Depakote had not been effective for him and caused more problems and side effects that has reduced his quality of life. Has also been on Keppra, but did not feel like this was as effective and he was unable to tolerate higher doses secondary to agitation. Based on this, the Zonegran was added in an attempt to help with seizure control, and the depakote discontinued."  Per patient's son,  the patient has subsequently been taking Keppra at 500 mg BID for several years.  He was on Zonegran at the same time, but it was tapered off a few months ago with no increase in seizure frequency. Prior to admission, the patient was only on Keppra at 500 mg BID and was having breakthrough seizures an average of about 5 times per year.   ROS: Unable to obtain due to altered mental status- confusion.  Past Medical History:  Diagnosis Date  . CAD (coronary artery disease)   . Esophageal reflux   . Heart disease   . History of PTCA   . Hodgkin lymphoma ([place])    chemo  . HTN (hypertension)   . Other and unspecified hyperlipidemia    Past Surgical History:  Procedure Laterality Date  . APPENDECTOMY  1951  . CORONARY ANGIOPLASTY  2005  . SHOULDER SURGERY     repair  . TONSILLECTOMY  1940   Family History  Problem Relation Age of Onset  . Heart attack Father   . Coronary artery disease Father   . COPD Mother   . Cancer Mother   . Atrial fibrillation Brother   . Parkinsonism Brother   . Heart attack Other        family history  Social History:   reports that he has quit smoking. His smoking use included cigarettes. He does not have any smokeless tobacco history on file. He reports current alcohol use. He reports that he does not use drugs.  Medications  Current Facility-Administered Medications:  .  acetaminophen (TYLENOL) tablet 650 mg, 650 mg, Oral, Q6H PRN, Irene Pap N, DO .  aspirin EC tablet 81 mg, 81 mg, Oral, Daily, Hall, Carole N, DO, 81 mg at 05/09/20 1046 .  clonazePAM (KLONOPIN) tablet 1 mg, 1 mg, Oral, QHS, Hall, Carole N, DO, 1 mg at 05/09/20 0034 .  darifenacin (ENABLEX) 24 hr tablet 7.5 mg, 7.5 mg, Oral, Daily, Hall, Carole N, DO, 7.5 mg at 05/09/20 1049 .  enoxaparin (LOVENOX) injection 40 mg, 40 mg, Subcutaneous, Q24H, Hall, Carole N, DO, 40 mg at 05/09/20 0345 .  ezetimibe (ZETIA) tablet 10 mg, 10 mg, Oral, Daily, Hall, Carole N, DO, 10 mg at 05/09/20 1048 .  gabapentin (NEURONTIN) capsule 600 mg, 600 mg, Oral, TID, Hall, Carole N, DO, 600  mg at 05/09/20 1044 .  isosorbide mononitrate (IMDUR) 24 hr tablet 60 mg, 60 mg, Oral, Daily, Hall, Carole N, DO, 60 mg at 05/09/20 1046 .  ketorolac (TORADOL) 15 MG/ML injection 15 mg, 15 mg, Intravenous, Q6H PRN, Hall, Carole N, DO .  lactated ringers infusion, , Intravenous, Continuous, Kayleen Memos, DO, Last Rate: 50 mL/hr at 05/09/20 0341, New Bag at 05/09/20 0341 .  latanoprost (XALATAN) 0.005 % ophthalmic solution 1 drop, 1 drop, Both Eyes, QHS, Hall, Carole N, DO, 1 drop at 05/09/20 0042 .  levETIRAcetam (KEPPRA) tablet 500 mg, 500 mg, Oral, BID, Hall, Carole N, DO, 500 mg at 05/09/20 1045 .  loratadine (CLARITIN) tablet 10 mg, 10 mg, Oral, Daily, Hall, Carole N, DO, 10 mg at 05/09/20 1047 .  montelukast (SINGULAIR) tablet 10 mg, 10 mg, Oral, Daily, Hall, Carole N, DO, 10 mg at 05/09/20 1049 .  pantoprazole (PROTONIX) EC tablet 40 mg, 40 mg, Oral, Daily, Hall, Carole N, DO, 40 mg at 05/09/20 1046 .  polyethylene glycol (MIRALAX / GLYCOLAX) packet 17 g, 17 g, Oral, Daily PRN, Hall, Carole N, DO .  rosuvastatin (CRESTOR) tablet 5 mg,  5 mg, Oral, Daily, Hall, Carole N, DO .  tamsulosin (FLOMAX) capsule 0.4 mg, 0.4 mg, Oral, Daily, Hall, Carole N, DO, 0.4 mg at 05/09/20 1048  Current Outpatient Medications:  .  aspirin 81 MG tablet, Take 81 mg by mouth daily., Disp: , Rfl:  .  clonazePAM (KLONOPIN) 1 MG tablet, Take 1 mg by mouth at bedtime., Disp: , Rfl:  .  ezetimibe (ZETIA) 10 MG tablet, Take 10 mg by mouth daily., Disp: , Rfl:  .  gabapentin (NEURONTIN) 300 MG capsule, Take 600 mg by mouth 3 (three) times daily., Disp: , Rfl:  .  isosorbide mononitrate (IMDUR) 60 MG 24 hr tablet, Take 60 mg by mouth daily., Disp: , Rfl:  .  latanoprost (XALATAN) 0.005 % ophthalmic solution, Place 1 drop into both eyes at bedtime., Disp: , Rfl:  .  loratadine (CLARITIN) 10 MG tablet, Take 10 mg by mouth daily., Disp: , Rfl:  .  montelukast (SINGULAIR) 10 MG tablet, Take 1 tablet by mouth daily., Disp: ,  Rfl:  .  Multiple Vitamins-Minerals (MULTIVITAMIN ADULTS) TABS, Take 1 tablet by mouth daily., Disp: , Rfl:  .  Omega-3 Fatty Acids (FISH OIL) 1000 MG CAPS, Take 1 capsule by mouth daily., Disp: , Rfl:  .  omeprazole (PRILOSEC) 40 MG capsule, Take 1 capsule by mouth 2 (two) times daily., Disp: , Rfl:  .  rosuvastatin (CRESTOR) 5 MG tablet, Take 5 mg by mouth daily., Disp: , Rfl:  .  solifenacin (VESICARE) 5 MG tablet, Take 5 mg by mouth every evening., Disp: , Rfl:  .  tamsulosin (FLOMAX) 0.4 MG CAPS capsule, Take 0.4 mg by mouth in the morning and at bedtime., Disp: , Rfl:   Exam: Current vital signs: BP (!) 169/84   Pulse 81   Temp 98.5 F (36.9 C) (Oral)   Resp 20   SpO2 96%  Vital signs in last 24 hours: Temp:  [98.3 F (36.8 C)-98.5 F (36.9 C)] 98.5 F (36.9 C) (05/07 1055) Pulse Rate:  [70-81] 81 (05/07 1000) Resp:  [18-20] 20 (05/07 1000) BP: (142-169)/(66-93) 169/84 (05/07 1000) SpO2:  [96 %-99 %] 96 % (05/07 1000)  GENERAL: Awake, alert, in no acute distress, resting and action tremors noted of bilateral upper extremities and head Psych: Affect appropriate for situation, calm and cooperative with examination Head: Normocephalic and atraumatic, dry mm EENT: Hearing aids in place though patient remains hard of hearing, wears eyeglasses at baseline, normal conjunctivae LUNGS: Normal respiratory effort. Non-labored breathing CV: Regular rate on tele ABDOMEN: Soft, non-distended Ext: warm, well perfused, 1+ non-pitting edema of bilateral lower extremities noted  NEURO:  Mental Status: Awake, alert, and oriented to self, president, season, state, and is able to state that we are in the hospital. He is unable to accurately state the day of the weak, month, year, or city. Speech is intact without dysarthria. Naming is partially intact with 2/3 correct. He is able to identify the thumb and little finger but unable to correctly name the index finger. Poor attention is noted.   Cranial Nerves:  II: PERRL. Visual fields full.  III, IV, VI: EOMI with saccadic pursuits on examination V: Sensation is intact to cool temperature and symmetrical to face. VII: Face is symmetric resting and smiling.  VIII: Hearing is intact to loud voice. Hard of hearing despite hearing aids in place. IX, X: Palate elevation is symmetric. Phonation normal.  XI: Normal sternocleidomastoid and trapezius muscle strength XII: Tongue protrudes midline without fasciculations.  Motor: Bilateral upper extremities with 4+/5 strength. Bilateral lower extremities with brief antigravity movement from the hip without asymmetry. BLE pain-limited assessment due to increased lower back pain with antigravity movement. Bilateral ankles 5/5 strength. Low amplitude tremor noted at rest and with action of bilateral upper extremities and head. Tone is increased throughout with mild cogwheel rigidity in bilateral upper extremities. Bulk is normal.  Sensation: Intact and symmetric to cool temperature bilaterally in upper and lower extremities.   Coordination: FTN with slow movement but without ataxia. HKS unable to be assessed due to increased lower back pain with lower extremity antigravity movement.  DTRs: 1+ left patellae, right patellae with trace reflex. Unable to elicit ankle reflex. Due to increased tone, cannot elicit upper extremity reflexes. Plantar: Toes mute bilaterally Gait- Deferred  Labs I have reviewed labs in epic and the results pertinent to this consultation are:  CBC    Component Value Date/Time   WBC 6.9 05/09/2020 0315   RBC 4.83 05/09/2020 0315   HGB 14.1 05/09/2020 0315   HCT 43.7 05/09/2020 0315   PLT 170 05/09/2020 0315   MCV 90.5 05/09/2020 0315   MCH 29.2 05/09/2020 0315   MCHC 32.3 05/09/2020 0315   RDW 13.9 05/09/2020 0315   LYMPHSABS 1.8 12/29/2008 1222   MONOABS 0.7 12/29/2008 1222   EOSABS 0.3 12/29/2008 1222   BASOSABS 0.1 12/29/2008 1222   CMP     Component Value  Date/Time   NA 138 05/09/2020 0315   K 3.8 05/09/2020 0315   CL 106 05/09/2020 0315   CO2 26 05/09/2020 0315   GLUCOSE 112 (H) 05/09/2020 0315   BUN 13 05/09/2020 0315   CREATININE 0.87 05/09/2020 0315   CALCIUM 8.8 (L) 05/09/2020 0315   PROT 7.1 05/08/2020 1129   ALBUMIN 3.6 05/08/2020 1129   AST 22 05/08/2020 1129   ALT 13 05/08/2020 1129   ALKPHOS 42 05/08/2020 1129   BILITOT 0.8 05/08/2020 1129   GFRNONAA >60 05/09/2020 0315   GFRAA  12/29/2008 1222    >60        The eGFR has been calculated using the MDRD equation. This calculation has not been validated in all clinical situations. eGFR's persistently <60 mL/min signify possible Chronic Kidney Disease.   Urinalysis    Component Value Date/Time   COLORURINE YELLOW 05/08/2020 1110   APPEARANCEUR CLEAR 05/08/2020 1110   LABSPEC 1.017 05/08/2020 1110   PHURINE 6.0 05/08/2020 1110   GLUCOSEU NEGATIVE 05/08/2020 1110   HGBUR NEGATIVE 05/08/2020 1110   BILIRUBINUR NEGATIVE 05/08/2020 [place] 05/08/2020 1110   PROTEINUR 100 (A) 05/08/2020 1110   NITRITE NEGATIVE 05/08/2020 1110   LEUKOCYTESUR NEGATIVE 05/08/2020 1110   Lipid Panel  No results found for: CHOL, TRIG, HDL, CHOLHDL, VLDL, LDLCALC, LDLDIRECT No results found for: HGBA1C  Imaging I have reviewed the images obtained:  CT-scan of the brain 5/6: 1. Generalized cerebral atrophy. 2. No acute intracranial abnormality  MRI thoracic and lumbar spine: 1. Probable acute mild superior endplate compression fracture at L1 with associated marrow edema. 2. Possible minimal superior endplate compression fracture at L2. 3. No acute findings in the thoracic spine. Old healed T3 compression deformity. 4. Lumbar spondylosis as described with disc bulging, endplate osteophytes and facet hypertrophy. At L4-5, the lateral recesses and left foramen are mildly narrowed. At L5-S1, there is asymmetric right lateral recess and right foraminal narrowing.  EEG  5/7: This study is suggestive of mild diffuse encephalopathy, nonspecific to etiology. No seizures  or epileptiform discharges were seen throughout the recording.  Assessment: 87 year old male with history as above who presented to the ED 5/6 for evaluation of a 2 week history of progressive cognitive decline, multiple falls, and lower back pain.These symptoms were preceded by a seizure 2 weeks ago. Patient has a history of epilepsy but not currently on an anticonvulsant per medications list in Epic. Spine imaging concerning for L1 - L2 vertebral fractures with L1 associated marrow edema. CT head without acute abnormality, but with generalized cerebral atrophy noted.  - Examination revealed patient to be with poor orientation but fluent speech with intact comprehension. He is oriented to self but with incompletee orientation to time, place, or situation. He has pain with lower extremity antigravity movement, mild cogwheel rigidity of bilateral upper extremities, and upper extremity tremors with rest which become more pronounced with action. - MRI of thoracic and lumbar spine with probable acute mild superior endplate compression fracture at L1 with associated marrow edema, possible minimal superior endplate compression fracture at L2, and lumbar spondylosis. Radiology is being consulted for planned kyphoplasty today. - History of seizures without home AED based on review of medications.  - EEG: This study is suggestive of mild diffuse encephalopathy, nonspecific to etiology. No seizures or epileptiform discharges were seen throughout the recording.  - DDx includes acute stroke. MRI brain pending for further evaluation.   Impression:: - L1-L2 acute fractures with L1 associated marrow edema - Subacute cognitive decline over the past 2 weeks: DDx includes subclinical seizures with postictal state, toxic/metabolic/infectious encephalopathy and parietotemporal stroke.  - Frequent falls  - History of seizures  with breakthrough seizure 2 weeks ago while on Keppra 500 mg BID  Recommendations: - MRI brain when able - Continue Keppra 500 mg BID (PO and IV are 1:1 conversion). Per chart review and discussion with the patient's son, this has been the most effective medication for control of the patient's seizures, although with side effect of agitation. Previously tried Depakote was not effective in controlling seizures. A trial of Zonegran together with Keppra had not resulted in decreased seizure frequency with the combination; therefore, the Zonegran was tapered off several months ago.   - The patient's son, Theodore Jones has been updated regarding his father's condition. He has consented to an MRI. He can be reached at [phone]  Pt seen by NP/Neuro  Theodore Jones, AGAC-NP [facility] Pager: [phone]  I have seen and examined the patient. I have formulated the assessment and recommendations. 87 year old male presenting with worsened cognition over the past 2 weeks and multiple falls. EEG shows no epileptiform activity. Recommendations include MRI brain to assess for possible stroke.  Electronically signed: Dr. Kerney Elbe

## 2020-05-09 NOTE — Progress Notes (Signed)
PT Cancellation Note  Patient Details Name: Antwain Caliendo MRN: [id number] DOB: [birth year]   Cancelled Treatment:    Reason Eval/Treat Not Completed: Pain limiting ability to participate Discussed with RN who stated pt not yet appropriate for therapy evaluation; pt having uncontrolled pain with just scooting up in the bed.   Wyona Almas, PT, DPT Acute Rehabilitation Services Pager [phone] Office [phone]    Deno Etienne 05/09/2020, 3:38 PM

## 2020-05-09 NOTE — ED Notes (Signed)
EEG being done at bedside.

## 2020-05-09 NOTE — TOC CAGE-AID Note (Signed)
Transition of Care [facility]) - CAGE-AID Screening   Patient Details  Name: Theodore Jones MRN: [id number] Date of Birth: Jul 24, 1931  [facility]) CM/SW Contact:    Clovis Cao, RN Phone Number: [phone] 05/09/2020, 7:32 PM   Clinical Narrative: Pt having progressive confusion -- unable to participate in exam.   CAGE-AID Screening: Substance Abuse Screening unable to be completed due to: : Patient unable to participate             Substance Abuse Education Offered: No

## 2020-05-09 NOTE — Progress Notes (Signed)
Patients family member stated patient does not wear BIPAP anymore at home.

## 2020-05-09 NOTE — Consult Note (Addendum)
Chief Complaint: Patient was seen in consultation today for L1 Kyphoplasty at the request of Dr Aileen Fass   Supervising Physician: Jacqulynn Cadet  Patient Status: [facility] - ED  History of Present Illness: Theodore Jones is a 87 y.o. male   OSA; Sz disorder; CAD: HTN; HLD Admitted from home with AMS- worsening in last 3 weeks Rapid decline in cognition per son (ICU RN) Frequent falls Low back pain - severe--- son says his father is to point where he cant even get out of bed secondary pain. Son is helping him with everything-- unable to ambulate without pain at home  MRI yesterday:IMPRESSION: 1. Probable acute mild superior endplate compression fracture at L1 with associated marrow edema. 2. Possible minimal superior endplate compression fracture at L2. 3. No acute findings in the thoracic spine. Old healed T3 compression deformity. 4. Lumbar spondylosis as described with disc bulging, endplate osteophytes and facet hypertrophy. At L4-5, the lateral recesses and left foramen are mildly narrowed. At L5-S1, there is asymmetric right lateral recess and right foraminal narrowing.  I have seen and examined pt--- he is definitely tender at low back site. Unable to roll to left Painful but able to roll to right  Imaging reviewed with Dr Laurence Ferrari L1 KP approved Insurance will need to be pre authorized before we can move ahead Plan for possible early next week Son aware   Past Medical History:  Diagnosis Date  . CAD (coronary artery disease)   . Esophageal reflux   . Heart disease   . History of PTCA   . Hodgkin lymphoma ([place])    chemo  . HTN (hypertension)   . Other and unspecified hyperlipidemia     Past Surgical History:  Procedure Laterality Date  . APPENDECTOMY  1951  . CORONARY ANGIOPLASTY  2005  . SHOULDER SURGERY     repair  . TONSILLECTOMY  1940    Allergies: Patient has no known allergies.  Medications: Prior to Admission medications    Medication Sig Start Date End Date Taking? Authorizing Provider  aspirin 81 MG tablet Take 81 mg by mouth daily.   Yes [provider]  clonazePAM (KLONOPIN) 1 MG tablet Take 1 mg by mouth at bedtime. 03/03/20  Yes [provider]  ezetimibe (ZETIA) 10 MG tablet Take 10 mg by mouth daily.   Yes [provider]  gabapentin (NEURONTIN) 300 MG capsule Take 600 mg by mouth 3 (three) times daily. 03/28/20  Yes [provider]  isosorbide mononitrate (IMDUR) 60 MG 24 hr tablet Take 60 mg by mouth daily. 02/29/20  Yes [provider]  latanoprost (XALATAN) 0.005 % ophthalmic solution Place 1 drop into both eyes at bedtime. 04/28/20  Yes [provider]  loratadine (CLARITIN) 10 MG tablet Take 10 mg by mouth daily.   Yes [provider]  montelukast (SINGULAIR) 10 MG tablet Take 1 tablet by mouth daily. 03/09/20  Yes [provider]  Multiple Vitamins-Minerals (MULTIVITAMIN ADULTS) TABS Take 1 tablet by mouth daily.   Yes [provider]  Omega-3 Fatty Acids (FISH OIL) 1000 MG CAPS Take 1 capsule by mouth daily.   Yes [provider]  omeprazole (PRILOSEC) 40 MG capsule Take 1 capsule by mouth 2 (two) times daily. 03/28/20  Yes [provider]  rosuvastatin (CRESTOR) 5 MG tablet Take 5 mg by mouth daily. 04/27/20  Yes [provider]  solifenacin (VESICARE) 5 MG tablet Take 5 mg by mouth every evening. 01/20/20  Yes [provider]  tamsulosin (FLOMAX) 0.4 MG CAPS capsule Take 0.4 mg by mouth in the morning and at bedtime. 01/29/20  Yes [provider]     Family History  Problem Relation Age of Onset  . Heart attack Father   . Coronary artery disease Father   . COPD Mother   . Cancer Mother   . Atrial fibrillation Brother   . Parkinsonism Brother   . Heart attack Other        family history    Social History   Socioeconomic History  . Marital status: Married    Spouse name:  Not on file  . Number of children: 3  . Years of education: Not on file  . Highest education level: Not on file  Occupational History  . Occupation: reitired    Comment: Media planner  Tobacco Use  . Smoking status: Former Smoker    Types: Cigarettes  . Smokeless tobacco: Not on file  . Tobacco comment: quit around age 37  Substance and Sexual Activity  . Alcohol use: Yes    Comment: occasionally  . Drug use: No  . Sexual activity: Not on file  Other Topics Concern  . Not on file  Social History Narrative  . Not on file   Social Determinants of Health   Financial Resource Strain: Not on file  Food Insecurity: Not on file  Transportation Needs: Not on file  Physical Activity: Not on file  Stress: Not on file  Social Connections: Not on file    Review of Systems: A 12 point ROS discussed and pertinent positives are indicated in the HPI above.  All other systems are negative.  Review of Systems  Constitutional: Positive for activity change.  Respiratory: Negative for shortness of breath.   Cardiovascular: Negative for chest pain.  Gastrointestinal: Negative for abdominal pain.  Musculoskeletal: Positive for back pain and gait problem.  Psychiatric/Behavioral: Positive for confusion. Negative for behavioral problems.    Vital Signs: BP (!) 169/84   Pulse 81   Temp 98.5 F (36.9 C) (Oral)   Resp 20   SpO2 96%   Physical Exam Vitals reviewed.  HENT:     Mouth/Throat:     Mouth: Mucous membranes are moist.  Cardiovascular:     Rate and Rhythm: Normal rate and regular rhythm.     Heart sounds: Normal heart sounds.  Pulmonary:     Breath sounds: Normal breath sounds. No wheezing.  Abdominal:     Palpations: Abdomen is soft.  Musculoskeletal:        General: Normal range of motion.     Right lower leg: No edema.     Left lower leg: No edema.     Comments: Follows all commands Can bend both knees-- painful to do so in low back Rolled to right:  Palpation is  tender at L1 and 2 sites  Neurological:     Mental Status: He is alert. Mental status is at baseline.  Psychiatric:     Comments: Spoke to son Matilde Haymaker move ahead with plans for L1 KP     Imaging: DG Lumbar Spine Complete  Result Date: 05/08/2020 CLINICAL DATA:  Back pain.  Multiple falls over the last 3 days. EXAM: LUMBAR SPINE - COMPLETE 4+ VIEW COMPARISON:  Report from 06/16/2015 FINDINGS: Aortoiliac atherosclerotic vascular disease. Reduced intervertebral disc height at L5-S1 indicative of degenerative disc disease. Degenerative facet arthropathy at L4-5 and L5-S1 bilaterally. No fracture or acute subluxation is identified. IMPRESSION: 1.  No acute bony findings. 2. Degenerative disc disease at L5-S1. 3. Lower lumbar spondylosis particularly of the facet joints at L4-5 and L5-S1. 4.  Aortic Atherosclerosis (ICD10-I70.0). Electronically Signed   By: Van Clines M.D.   On: 05/08/2020 14:49   CT Head Wo Contrast  Result Date: 05/08/2020 CLINICAL DATA:  Dizziness. EXAM: CT HEAD WITHOUT CONTRAST TECHNIQUE: Contiguous axial images were obtained from the base of the skull through the vertex without intravenous contrast. COMPARISON:  February 06, 2015 FINDINGS: Brain: There is mild cerebral atrophy with widening of the extra-axial spaces and ventricular dilatation. There are areas of decreased attenuation within the white matter tracts of the supratentorial brain, consistent with microvascular disease changes. A small, chronic left para thalamic and right basal ganglia lacunar infarcts are noted. Vascular: No hyperdense vessel or unexpected calcification. Skull: Normal. Negative for fracture or focal lesion. Sinuses/Orbits: No acute finding. Other: None. IMPRESSION: 1. Generalized cerebral atrophy. 2. No acute intracranial abnormality. Electronically Signed   By: Virgina Norfolk M.D.   On: 05/08/2020 18:29   MR THORACIC SPINE WO CONTRAST  Result Date: 05/08/2020 CLINICAL DATA:  Back injury 2  days ago. Frequent falls over the last 2 weeks. Mid to low back pain. EXAM: MRI THORACIC AND LUMBAR SPINE WITHOUT CONTRAST TECHNIQUE: Multiplanar and multiecho pulse sequences of the thoracic and lumbar spine were obtained without intravenous contrast. COMPARISON:  Lumbar spine radiographs 05/08/2020. Cervical spine radiographs 01/29/2009 FINDINGS: MRI THORACIC SPINE FINDINGS Alignment:  Normal. Vertebrae: There is a chronic appearing T3 compression deformity with approximately 50% loss of vertebral body height and minimal retropulsion of the inferior endplate. There is no residual bone marrow edema. The posterior elements are intact. No acute fractures are seen within the thoracic spine. Cord: Normal in signal and caliber. Conus medullaris extends to the L2 level. Paraspinal and other soft tissues: No significant paraspinal findings. Small right greater than left dependent pleural effusions. Disc levels: As above, mild osseous retropulsion at T3-4 related to the chronic T3 compression deformity. No cord deformity or foraminal compromise. Mild disc bulging and endplate osteophyte formation within the lower thoracic spine without significant disc herniation, spinal stenosis or nerve root encroachment. MRI LUMBAR SPINE FINDINGS Segmentation:  There are 5 lumbar type vertebral bodies. Alignment:  Physiologic. Vertebrae: There is superior endplate irregularity at L1 with associated linear low signal and surrounding marrow edema in the L1 vertebral body, suspicious for an acute compression deformity. There is also mild marrow edema within the anterior superior corner of the L2 vertebral body which could reflect an additional fracture or degenerative edema. The posterior elements are intact. There is no osseous retropulsion. Conus medullaris and cauda equina: Conus extends to the L2 level. Conus and cauda equina appear normal. Paraspinal and other soft tissues: Mild paraspinous edema anteriorly at L1, supporting an acute  fracture. Disc levels: L1-2: Loss of disc height with mild disc bulging and facet hypertrophy. No spinal stenosis or nerve root encroachment. L2-3: No significant findings. L3-4: Mild loss of disc height with annular disc bulging and facet hypertrophy. No significant spinal stenosis or nerve root encroachment. L4-5: Mild loss of disc height with annular disc bulging, facet and ligamentous hypertrophy. Mild lateral recess and left foraminal narrowing without definite nerve root encroachment. L5-S1: Broad-based disc protrusion with covering spur in the right subarticular zone and medial foramen. This causes asymmetric narrowing of the right lateral recess and possible right S1 nerve root encroachment. In addition, there is mild right foraminal narrowing. Mild bilateral facet hypertrophy.  IMPRESSION: 1. Probable acute mild superior endplate compression fracture at L1 with associated marrow edema. 2. Possible minimal superior endplate compression fracture at L2. 3. No acute findings in the thoracic spine. Old healed T3 compression deformity. 4. Lumbar spondylosis as described with disc bulging, endplate osteophytes and facet hypertrophy. At L4-5, the lateral recesses and left foramen are mildly narrowed. At L5-S1, there is asymmetric right lateral recess and right foraminal narrowing. Electronically Signed   By: Richardean Sale M.D.   On: 05/08/2020 20:09   MR LUMBAR SPINE WO CONTRAST  Result Date: 05/08/2020 CLINICAL DATA:  Back injury 2 days ago. Frequent falls over the last 2 weeks. Mid to low back pain. EXAM: MRI THORACIC AND LUMBAR SPINE WITHOUT CONTRAST TECHNIQUE: Multiplanar and multiecho pulse sequences of the thoracic and lumbar spine were obtained without intravenous contrast. COMPARISON:  Lumbar spine radiographs 05/08/2020. Cervical spine radiographs 01/29/2009 FINDINGS: MRI THORACIC SPINE FINDINGS Alignment:  Normal. Vertebrae: There is a chronic appearing T3 compression deformity with approximately 50%  loss of vertebral body height and minimal retropulsion of the inferior endplate. There is no residual bone marrow edema. The posterior elements are intact. No acute fractures are seen within the thoracic spine. Cord: Normal in signal and caliber. Conus medullaris extends to the L2 level. Paraspinal and other soft tissues: No significant paraspinal findings. Small right greater than left dependent pleural effusions. Disc levels: As above, mild osseous retropulsion at T3-4 related to the chronic T3 compression deformity. No cord deformity or foraminal compromise. Mild disc bulging and endplate osteophyte formation within the lower thoracic spine without significant disc herniation, spinal stenosis or nerve root encroachment. MRI LUMBAR SPINE FINDINGS Segmentation:  There are 5 lumbar type vertebral bodies. Alignment:  Physiologic. Vertebrae: There is superior endplate irregularity at L1 with associated linear low signal and surrounding marrow edema in the L1 vertebral body, suspicious for an acute compression deformity. There is also mild marrow edema within the anterior superior corner of the L2 vertebral body which could reflect an additional fracture or degenerative edema. The posterior elements are intact. There is no osseous retropulsion. Conus medullaris and cauda equina: Conus extends to the L2 level. Conus and cauda equina appear normal. Paraspinal and other soft tissues: Mild paraspinous edema anteriorly at L1, supporting an acute fracture. Disc levels: L1-2: Loss of disc height with mild disc bulging and facet hypertrophy. No spinal stenosis or nerve root encroachment. L2-3: No significant findings. L3-4: Mild loss of disc height with annular disc bulging and facet hypertrophy. No significant spinal stenosis or nerve root encroachment. L4-5: Mild loss of disc height with annular disc bulging, facet and ligamentous hypertrophy. Mild lateral recess and left foraminal narrowing without definite nerve root  encroachment. L5-S1: Broad-based disc protrusion with covering spur in the right subarticular zone and medial foramen. This causes asymmetric narrowing of the right lateral recess and possible right S1 nerve root encroachment. In addition, there is mild right foraminal narrowing. Mild bilateral facet hypertrophy. IMPRESSION: 1. Probable acute mild superior endplate compression fracture at L1 with associated marrow edema. 2. Possible minimal superior endplate compression fracture at L2. 3. No acute findings in the thoracic spine. Old healed T3 compression deformity. 4. Lumbar spondylosis as described with disc bulging, endplate osteophytes and facet hypertrophy. At L4-5, the lateral recesses and left foramen are mildly narrowed. At L5-S1, there is asymmetric right lateral recess and right foraminal narrowing. Electronically Signed   By: Richardean Sale M.D.   On: 05/08/2020 20:09   EEG adult  Result Date: 05/09/2020 Lora Havens, MD     05/09/2020 10:58 AM Patient Name: Trinten Platt MRN: [id number] Epilepsy Attending: Lora Havens Referring Physician/Provider: Dr Irene Pap Date: 05/09/2020 Duration: 28.29 mins Patient history: 87yo M with ams. EEG to evaluate for seizure Level of alertness: Awake AEDs during EEG study: LEV Technical aspects: This EEG study was done with scalp electrodes positioned according to the 10-20 International system of electrode placement. Electrical activity was acquired at a sampling rate of 500Hz  and reviewed with a high frequency filter of 70Hz  and a low frequency filter of 1Hz . EEG data were recorded continuously and digitally stored. Description: The posterior dominant rhythm consists of 8 Hz activity of moderate voltage (25-35 uV) seen predominantly in posterior head regions, symmetric and reactive to eye opening and eye closing. EEG showed intermittent generalized 3 to 6 Hz theta-delta slowing. Hyperventilation and photic stimulation were not performed.   ABNORMALITY -  Intermittent slow, generalized IMPRESSION: This study is suggestive of mild diffuse encephalopathy, nonspecific etiology. No seizures or epileptiform discharges were seen throughout the recording. Priyanka O Yadav    Labs:  CBC: Recent Labs    05/08/20 1129 05/09/20 0315  WBC 8.7 6.9  HGB 14.8 14.1  HCT 45.4 43.7  PLT 159 170    COAGS: No results for input(s): INR, APTT in the last 8760 hours.  BMP: Recent Labs    05/08/20 1129 05/09/20 0315  NA 135 138  K 4.3 3.8  CL 104 106  CO2 23 26  GLUCOSE 115* 112*  BUN 15 13  CALCIUM 9.3 8.8*  CREATININE 0.97 0.87  GFRNONAA >60 >60    LIVER FUNCTION TESTS: Recent Labs    05/08/20 1129  BILITOT 0.8  AST 22  ALT 13  ALKPHOS 42  PROT 7.1  ALBUMIN 3.6    TUMOR MARKERS: No results for input(s): AFPTM, CEA, CA199, CHROMGRNA in the last 8760 hours.  Assessment and Plan:  Severe low back pain Acute painful fx Lumbar 1 Approved for Kyphoplasty per Dr Laurence Ferrari Insurance to be pre authorized asap Plan for possible early next week-  Son [name] aware Risks and benefits of Lumbar 1 Kyphoplasty were discussed with the patient's son [name] via phone including, but not limited to education regarding the natural healing process of compression fractures without intervention, bleeding, infection, cement migration which may cause spinal cord damage, paralysis, pulmonary embolism or even death.  This interventional procedure involves the use of X-rays and because of the nature of the planned procedure, it is possible that we will have prolonged use of X-ray fluoroscopy.  Potential radiation risks to you include (but are not limited to) the following: - A slightly elevated risk for cancer  several years later in life. This risk is typically less than 0.5% percent. This risk is low in comparison to the normal incidence of human cancer, which is 33% for women and 50% for men according to the [place]. - Radiation induced  injury can include skin redness, resembling a rash, tissue breakdown / ulcers and hair loss (which can be temporary or permanent).   The likelihood of either of these occurring depends on the difficulty of the procedure and whether you are sensitive to radiation due to previous procedures, disease, or genetic conditions.   IF your procedure requires a prolonged use of radiation, you will be notified and given written instructions for further action.  It is your responsibility to monitor the irradiated area for the 2 weeks following  the procedure and to notify your physician if you are concerned that you have suffered a radiation induced injury.    All questions were answered, son [name] (POA)is agreeable to proceed. Consent signed and in chart.  Dependent on Insurance approval- son aware  Thank you for this interesting consult.  I greatly enjoyed meeting Rafi Seliga and look forward to participating in their care.  A copy of this report was sent to the requesting provider on this date.  Electronically Signed: Lavonia Drafts, PA-C 05/09/2020, 11:23 AM   I spent a total of 40 Minutes    in face to face in clinical consultation, greater than 50% of which was counseling/coordinating care for L1 KP

## 2020-05-09 NOTE — ED Notes (Signed)
Patient transported to MRI 

## 2020-05-10 LAB — COMPREHENSIVE METABOLIC PANEL
ALT: 12 U/L (ref 0–44)
AST: 22 U/L (ref 15–41)
Albumin: 3.5 g/dL (ref 3.5–5.0)
Alkaline Phosphatase: 45 U/L (ref 38–126)
Anion gap: 10 (ref 5–15)
BUN: 14 mg/dL (ref 8–23)
CO2: 24 mmol/L (ref 22–32)
Calcium: 8.9 mg/dL (ref 8.9–10.3)
Chloride: 100 mmol/L (ref 98–111)
Creatinine, Ser: 0.92 mg/dL (ref 0.61–1.24)
GFR, Estimated: >60 mL/min (ref >60)
Glucose, Bld: 105 mg/dL — ABNORMAL HIGH (ref 70–99)
Potassium: 3.9 mmol/L (ref 3.5–5.1)
Sodium: 134 mmol/L — ABNORMAL LOW (ref 135–145)
Total Bilirubin: 1.3 mg/dL — ABNORMAL HIGH (ref 0.3–1.2)
Total Protein: 6.7 g/dL (ref 6.5–8.1)

## 2020-05-10 LAB — PROTIME-INR
INR: 1.1 (ref 0.8–1.2)
Prothrombin Time: 14.6 seconds (ref 11.4–15.2)

## 2020-05-10 LAB — CBC WITH DIFFERENTIAL/PLATELET
Abs Immature Granulocytes: 0.02 10*3/uL (ref 0.00–0.07)
Basophils Absolute: 0.1 10*3/uL (ref 0.0–0.1)
Basophils Relative: 1 %
Eosinophils Absolute: 0.3 10*3/uL (ref 0.0–0.5)
Eosinophils Relative: 4 %
HCT: 45.2 % (ref 39.0–52.0)
Hemoglobin: 15.1 g/dL (ref 13.0–17.0)
Immature Granulocytes: 0 %
Lymphocytes Relative: 18 %
Lymphs Abs: 1.7 10*3/uL (ref 0.7–4.0)
MCH: 29.2 pg (ref 26.0–34.0)
MCHC: 33.4 g/dL (ref 30.0–36.0)
MCV: 87.4 fL (ref 80.0–100.0)
Monocytes Absolute: 1.1 10*3/uL — ABNORMAL HIGH (ref 0.1–1.0)
Monocytes Relative: 12 %
Neutro Abs: 6.3 10*3/uL (ref 1.7–7.7)
Neutrophils Relative %: 65 %
Platelets: 167 10*3/uL (ref 150–400)
RBC: 5.17 MIL/uL (ref 4.22–5.81)
RDW: 13.8 % (ref 11.5–15.5)
WBC: 9.5 10*3/uL (ref 4.0–10.5)
nRBC: 0 % (ref 0.0–0.2)

## 2020-05-10 LAB — VITAMIN B12: Vitamin B-12: 494 pg/mL (ref 180–914)

## 2020-05-10 LAB — AMMONIA: Ammonia: 27 umol/L (ref 9–35)

## 2020-05-10 LAB — MAGNESIUM: Magnesium: 1.9 mg/dL (ref 1.7–2.4)

## 2020-05-10 LAB — TSH: TSH: 1.604 u[IU]/mL (ref 0.350–4.500)

## 2020-05-10 LAB — FOLATE: Folate: 28.9 ng/mL (ref >5.9)

## 2020-05-10 MED ORDER — LORAZEPAM 0.5 MG PO TABS
0.5000 mg | ORAL_TABLET | Freq: Once | ORAL | Status: AC
  Administered 2020-05-12: 0.5 mg via ORAL
  Filled 2020-05-10: qty 1

## 2020-05-10 MED ORDER — LEVETIRACETAM 750 MG PO TABS
750.0000 mg | ORAL_TABLET | Freq: Two times a day (BID) | ORAL | Status: DC
  Administered 2020-05-10 – 2020-05-14 (×9): 750 mg via ORAL
  Filled 2020-05-10 (×9): qty 1

## 2020-05-10 NOTE — Progress Notes (Signed)
Neurology Progress Note  Subjective: Patient with some documented agitation overnight but without other acute overnight events  Exam: Vitals:   05/10/20 0430 05/10/20 1456  BP: 129/76   Pulse: 76 83  Resp: 20   Temp: 98.8 F (37.1 C)   SpO2: 95% 99%   Gen: Sitting up in bed, in no acute distress Resp: non-labored breathing, no respiratory distress Abd: soft, non-tender, non-distended  Neuro: Mental Status: Awake, alert, and oriented to self, year, and place. He is unable to state the city that we are in, his age, or the month of the year. Speech is intact without dysarthria. He has poor attention but there is no evidence of aphasia or neglect on examination. He is able to provide minor details of his history of present illness but is unable to elaborate or recall events as told by his family.  Cranial Nerves: II: PERRL. Visual fields full.  III, IV, VI: EOMI with saccadic pursuits on examination V: Sensation is intact to cool temperature and symmetrical to face. VII: Face is symmetric resting and smiling.  VIII: Hearing is intact to loud voice. Extremely hard of hearing despite hearing aids in place. IX, X: Palate elevation is symmetric. Phonation normal.  XI: Normal sternocleidomastoid and trapezius muscle strength XII: Tongue protrudes midline without fasciculations.   Motor: Bilateral upper extremities with 4+/5 strength. Bilateral lower extremities with brief antigravity movement from the hip without asymmetry. BLE pain-limited assessment due to increased lower back pain with antigravity movement. Bilateral ankles 5/5 strength. Low amplitude tremor noted at rest and with action of bilateral upper extremities and head. Tone is increased throughout with mild cogwheel rigidity in bilateral upper extremities. Bulk is normal.  Sensation: Intact and symmetric to cool temperature bilaterally in upper and lower extremities.  Coordination: FTN with slow movement but without ataxia. HKS  unable to be assessed due to increased lower back pain with lower extremity antigravity movement.  DTRs: 1+ left patellae, right patellae with trace reflex.  Gait: Deferred  Pertinent Labs: CBC    Component Value Date/Time   WBC 9.5 05/10/2020 0013   RBC 5.17 05/10/2020 0013   HGB 15.1 05/10/2020 0013   HCT 45.2 05/10/2020 0013   PLT 167 05/10/2020 0013   MCV 87.4 05/10/2020 0013   MCH 29.2 05/10/2020 0013   MCHC 33.4 05/10/2020 0013   RDW 13.8 05/10/2020 0013   LYMPHSABS 1.7 05/10/2020 0013   MONOABS 1.1 (H) 05/10/2020 0013   EOSABS 0.3 05/10/2020 0013   BASOSABS 0.1 05/10/2020 0013   CMP     Component Value Date/Time   NA 134 (L) 05/10/2020 0013   K 3.9 05/10/2020 0013   CL 100 05/10/2020 0013   CO2 24 05/10/2020 0013   GLUCOSE 105 (H) 05/10/2020 0013   BUN 14 05/10/2020 0013   CREATININE 0.92 05/10/2020 0013   CALCIUM 8.9 05/10/2020 0013   PROT 6.7 05/10/2020 0013   ALBUMIN 3.5 05/10/2020 0013   AST 22 05/10/2020 0013   ALT 12 05/10/2020 0013   ALKPHOS 45 05/10/2020 0013   BILITOT 1.3 (H) 05/10/2020 0013   GFRNONAA >60 05/10/2020 0013   GFRAA  12/29/2008 1222    >60        The eGFR has been calculated using the MDRD equation. This calculation has not been validated in all clinical situations. eGFR's persistently <60 mL/min signify possible Chronic Kidney Disease.  No results found for: CHOL, HDL, LDLCALC, LDLDIRECT, TRIG, CHOLHDL No results found for: HGBA1C Lab Results  Component  Value Date   [id number] 05/10/2020   Lab Results  Component Value Date   TSH 1.604 05/10/2020   Imaging Reviewed:  MRI Brain: 1. No acute intracranial abnormality. 2. Moderate atrophy and advanced confluent white matter disease likely reflects the sequela of chronic microvascular ischemia. 3. 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 4. Bilateral mastoid effusions. No obstructing lesion is present.  CT-scan of the brain 5/6: 1.  Generalized cerebral atrophy. 2. No acute intracranial abnormality  MRI thoracic and lumbar spine: 1. Probable acute mild superior endplate compression fracture at L1 with associated marrow edema. 2. Possible minimal superior endplate compression fracture at L2. 3. No acute findings in the thoracic spine. Old healed T3 compression deformity. 4. Lumbar spondylosis as described with disc bulging, endplate osteophytes and facet hypertrophy. At L4-5, the lateral recesses and left foramen are mildly narrowed. At L5-S1, there is asymmetric right lateral recess and right foraminal narrowing.  EEG 5/7: This study is suggestive of mild diffuse encephalopathy, nonspecific to etiology. No seizures or epileptiform discharges were seen throughout the recording.  Assessment: 87 y.o. male who presented to the ED 5/6 for evaluation of a 2 week history of progressive cognitive decline, multiple falls, and lower back pain.These symptoms were preceded by a seizure 2 weeks ago. Patient has a history of epilepsy on Keppra 500 mg BID. Spine imaging concerning for L1 - L2 vertebral fractures with L1 associated marrow edema. CT head without acute abnormality, but with generalized cerebral atrophy noted.  - Examination reveals patient to be with poor orientation but fluent speech with intact comprehension. He is oriented to self but with incomplete orientation to time, place, or situation. He has pain with lower extremity antigravity movement, mild cogwheel rigidity of bilateral upper extremities, and upper extremity tremors with rest which become more pronounced with action. - MRI of thoracic and lumbar spine with probable acutem ild superior endplate compression fracture at L1 with associated marrow edema, possible minimal superior endplate compression fracture at L2, and lumbar spondylosis. Kyphoplasty to be complete next week if approved by patient's insurance. - MRI brain with no acute intracranial abnormality. Remote  lacunar infarcts of the thalami and centrum semi ovale bilaterally and with moderate atrophy and advanced confluent white matter disease. - Keppra has been the most effective medication for control of the patient's seizures, although with side effect of agitation. Previously tried Depakote was not effective in controlling seizures. A trial of Zonegran together with Keppra had not resulted in decreased seizure frequency with the combination; therefore, the Zonegran was tapered off several months ago.   - EEG: This study issuggestive of mild diffuse encephalopathy, nonspecific to etiology.No seizures or epileptiform discharges were seen throughout the recording.  - Acute stroke unlikely with negative MRI imaging. Presentation is consistent with progressive cognitive decline versus delirium or a component of both. Also, consider subclinical seizures with postictal state.   Impression:: - L1-L2 acute fractures with L1 associated marrow edema and low back pain, worsened with antigravity leg movement - Subacute cognitive decline over the past 2 weeks: DDx includes subclinical seizures with postictal state versus toxic/metabolic/infectious encephalopathy. Pain can also exacerbate confusion in the elderly - Frequent falls  - History of seizures with breakthrough seizure 2 weeks ago while on Keppra 500 mg BID  Recommendations: - Increasing Keppra to 750 mg BID  - PT/OT evaluation - Try to minimize deliriogenic medications as much as possible (J Am Geriatr Soc. 2012 Apr;60(4):616-31): benzodiazepines, anticholinergics, diphenhydramine, antihistamines,  narcotics, Ambien/Lunesta/Sonata etc. - Inpatient seizure precautions - IV Ativan 2 mg for any seizure lasting > 5 minutes and notify neurology  Anibal Henderson, AGACNP-BC [facility] [phone]  Electronically signed: Dr. Kerney Elbe

## 2020-05-10 NOTE — Progress Notes (Signed)
Pt had been trying to get out of the bed. Pt is not redirectable.

## 2020-05-10 NOTE — Progress Notes (Signed)
Patient ID: Theodore Jones, male   DOB: 06-04-1931, 87 y.o.   MRN: [id number]  PROGRESS NOTE    Theodore Jones  [id number]:[id number] DOB: 01/12/1931 DOA: 05/08/2020 PCP: Pcp, No   Brief Narrative:  87 y.o. male with medical history significant for OSA, on BiPAP followed by pulmonary, seizure disorder, previous MI, coronary artery disease, chronic constipation, essential hypertension, hyperlipidemia, GERD, who presented to [facility] ED from home due to worsening confusion, low back pain, and multiple falls along with generalized weakness and cognitive decline over the last 2 weeks.  On presentation, he was afebrile and lab studies were essentially unremarkable.  UA was negative for pyuria.  He was found to have compression fracture involving L1 and L2.  Assessment & Plan:   Acute metabolic encephalopathy, unclear etiology -Presented with worsening confusion and generalized weakness from home.  Work-up unrevealing so far including UA negative for pyuria and CT head negative for acute intracranial findings. -EEG showed no evidence of seizures. -DC IV fluids.  Avoid narcotics.   -vitamin K93, TSH, folic acid and ammonia levels normal -Monitor mental status.  Fall precautions. Son stated that mental status has progressively worsened over the last few weeks after a seizure episode.  -Neurology evaluation appreciated.  MRI of the brain is negative for acute stroke.  Acute compression fracture of L1 and L2 seen on MRI of lumbar spine presenting with lower back pain -IR consulted for vertebroplasty: Plan for L1 kyphoplasty next week  Frequent falls -PT/OT eval.  Might need placement.  TOC consult  Seizure disorder -Continue Keppra.  Follow EEG.  Essential hypertension -Blood pressure currently stable.  Continue isosorbide mononitrate.  Hyperlipidemia -Continue ezetimibe and rosuvastatin  BPH -Continue tamsulosin  GERD -Continue PPI  OSA on BiPAP  -resume BiPAP nightly  Chronic anxiety   -Resume home Klonopin and gabapentin.  Might have to decrease dose of gabapentin  Ambulatory dysfunction with frequent falls -PT/OT eval   DVT prophylaxis: Lovenox Code Status: DNR Family Communication: Son/Eric at bedside on 05/09/2020 Disposition Plan: Status is: Inpatient  Remains inpatient appropriate because:Inpatient level of care appropriate due to severity of illness   Dispo:  Patient From: Home  Planned Disposition: Home versus SNF  Medically stable for discharge: No    Consultants: IR/neurology  Procedures: EEG  Antimicrobials: None   Subjective: Patient seen and examined at bedside.  Poor historian, hard of hearing and slightly confused.  Does not feel well, complains of back pain.  No overnight fever, seizures reported.  Objective: Vitals:   05/09/20 1300 05/09/20 1548 05/09/20 1705 05/10/20 0430  BP: (!) 158/85 (!) 174/96 (!) 158/96 129/76  Pulse: 81 81 85 76  Resp: (!) 26 (!) 24 20 20   Temp:  98.1 F (36.7 C) 98 F (36.7 C) 98.8 F (37.1 C)  TempSrc:  Oral Oral Oral  SpO2: 92% 98% 98% 95%    Intake/Output Summary (Last 24 hours) at 05/10/2020 0733 Last data filed at 05/10/2020 2671 Gross per 24 hour  Intake 844.2 ml  Output 850 ml  Net -5.8 ml   There were no vitals filed for this visit.  Examination:  General exam: On room air currently.  No acute distress.  Looks chronically ill.  Elderly male lying in bed.   Respiratory system: Decreased breath sounds at bases bilaterally Cardiovascular system: Rate controlled, S1-S2 heard Gastrointestinal system: Abdomen is slightly distended, soft and nontender.  Bowel sounds are heard  extremities: Mild lower extremity edema present; no clubbing Central nervous system: Extremely hard of  hearing, awake.  Slightly confused.  Extremely poor historian.  No focal neurological deficits.  Moves extremities Skin: No obvious ecchymosis/rashes Psychiatry: Affect is flat.  Does not participate in conversation  much   Data Reviewed: I have personally reviewed following labs and imaging studies  CBC: Recent Labs  Lab 05/08/20 1129 05/09/20 0315 05/10/20 0013  WBC 8.7 6.9 9.5  NEUTROABS  --   --  6.3  HGB 14.8 14.1 15.1  HCT 45.4 43.7 45.2  MCV 89.4 90.5 87.4  PLT 159 170 [id number]   Basic Metabolic Panel: Recent Labs  Lab 05/08/20 1129 05/09/20 0315 05/10/20 0013  NA 135 138 134*  K 4.3 3.8 3.9  CL 104 106 100  CO2 23 26 24   GLUCOSE 115* 112* 105*  BUN 15 13 14   CREATININE 0.97 0.87 0.92  CALCIUM 9.3 8.8* 8.9  MG  --  2.1 1.9  PHOS  --  3.6  --    GFR: CrCl cannot be calculated (Unknown ideal weight.). Liver Function Tests: Recent Labs  Lab 05/08/20 1129 05/10/20 0013  AST 22 22  ALT 13 12  ALKPHOS 42 45  BILITOT 0.8 1.3*  PROT 7.1 6.7  ALBUMIN 3.6 3.5   No results for input(s): LIPASE, AMYLASE in the last 168 hours. Recent Labs  Lab 05/10/20 0013  AMMONIA 27   Coagulation Profile: Recent Labs  Lab 05/10/20 0013  INR 1.1   Cardiac Enzymes: No results for input(s): CKTOTAL, CKMB, CKMBINDEX, TROPONINI in the last 168 hours. BNP (last 3 results) No results for input(s): PROBNP in the last 8760 hours. HbA1C: No results for input(s): HGBA1C in the last 72 hours. CBG: No results for input(s): GLUCAP in the last 168 hours. Lipid Profile: No results for input(s): CHOL, HDL, LDLCALC, TRIG, CHOLHDL, LDLDIRECT in the last 72 hours. Thyroid Function Tests: Recent Labs    05/10/20 0034  TSH 1.604   Anemia Panel: Recent Labs    05/10/20 0013 05/10/20 0034  VITAMINB12 494  --   FOLATE  --  28.9   Sepsis Labs: No results for input(s): PROCALCITON, LATICACIDVEN in the last 168 hours.  Recent Results (from the past 240 hour(s))  Resp Panel by RT-PCR (Flu A&B, Covid) Nasopharyngeal Swab     Status: None   Collection Time: 05/08/20 10:46 PM   Specimen: Nasopharyngeal Swab; Nasopharyngeal(NP) swabs in vial transport medium  Result Value Ref Range Status    SARS Coronavirus 2 by RT PCR NEGATIVE NEGATIVE Final    Comment: (NOTE) SARS-CoV-2 target nucleic acids are NOT DETECTED.  The SARS-CoV-2 RNA is generally detectable in upper respiratory specimens during the acute phase of infection. The lowest concentration of SARS-CoV-2 viral copies this assay can detect is 138 copies/mL. A negative result does not preclude SARS-Cov-2 infection and should not be used as the sole basis for treatment or other patient management decisions. A negative result may occur with  improper specimen collection/handling, submission of specimen other than nasopharyngeal swab, presence of viral mutation(s) within the areas targeted by this assay, and inadequate number of viral copies(<138 copies/mL). A negative result must be combined with clinical observations, patient history, and epidemiological information. The expected result is Negative.  Fact Sheet for Patients:  EntrepreneurPulse.com.au  Fact Sheet for Healthcare Providers:  IncredibleEmployment.be  This test is no t yet approved or cleared by the [place] FDA and  has been authorized for detection and/or diagnosis of SARS-CoV-2 by FDA under an Emergency Use Authorization (EUA). This EUA will remain  in effect (meaning this test can be used) for the duration of the COVID-19 declaration under Section 564(b)(1) of the Act, 21 U.S.C.section 360bbb-3(b)(1), unless the authorization is terminated  or revoked sooner.       Influenza A by PCR NEGATIVE NEGATIVE Final   Influenza B by PCR NEGATIVE NEGATIVE Final    Comment: (NOTE) The Xpert Xpress SARS-CoV-2/FLU/RSV plus assay is intended as an aid in the diagnosis of influenza from Nasopharyngeal swab specimens and should not be used as a sole basis for treatment. Nasal washings and aspirates are unacceptable for Xpert Xpress SARS-CoV-2/FLU/RSV testing.  Fact Sheet for  Patients: EntrepreneurPulse.com.au  Fact Sheet for Healthcare Providers: IncredibleEmployment.be  This test is not yet approved or cleared by the [place] FDA and has been authorized for detection and/or diagnosis of SARS-CoV-2 by FDA under an Emergency Use Authorization (EUA). This EUA will remain in effect (meaning this test can be used) for the duration of the COVID-19 declaration under Section 564(b)(1) of the Act, 21 U.S.C. section 360bbb-3(b)(1), unless the authorization is terminated or revoked.  Performed at [facility], [place] [address] [place], [place] [address]          Radiology Studies: DG Lumbar Spine Complete  Result Date: 05/08/2020 CLINICAL DATA:  Back pain.  Multiple falls over the last 3 days. EXAM: LUMBAR SPINE - COMPLETE 4+ VIEW COMPARISON:  Report from 06/16/2015 FINDINGS: Aortoiliac atherosclerotic vascular disease. Reduced intervertebral disc height at L5-S1 indicative of degenerative disc disease. Degenerative facet arthropathy at L4-5 and L5-S1 bilaterally. No fracture or acute subluxation is identified. IMPRESSION: 1. No acute bony findings. 2. Degenerative disc disease at L5-S1. 3. Lower lumbar spondylosis particularly of the facet joints at L4-5 and L5-S1. 4.  Aortic Atherosclerosis (ICD10-I70.0). Electronically Signed   By: Van Clines M.D.   On: 05/08/2020 14:49   CT Head Wo Contrast  Result Date: 05/08/2020 CLINICAL DATA:  Dizziness. EXAM: CT HEAD WITHOUT CONTRAST TECHNIQUE: Contiguous axial images were obtained from the base of the skull through the vertex without intravenous contrast. COMPARISON:  February 06, 2015 FINDINGS: Brain: There is mild cerebral atrophy with widening of the extra-axial spaces and ventricular dilatation. There are areas of decreased attenuation within the white matter tracts of the supratentorial brain, consistent with microvascular disease changes. A small, chronic left para  thalamic and right basal ganglia lacunar infarcts are noted. Vascular: No hyperdense vessel or unexpected calcification. Skull: Normal. Negative for fracture or focal lesion. Sinuses/Orbits: No acute finding. Other: None. IMPRESSION: 1. Generalized cerebral atrophy. 2. No acute intracranial abnormality. Electronically Signed   By: Virgina Norfolk M.D.   On: 05/08/2020 18:29   MR BRAIN WO CONTRAST  Result Date: 05/09/2020 CLINICAL DATA:  Neuro deficit, acute, stroke suspected. New onset of progressive confusion and weakness. Seizures. EXAM: MRI HEAD WITHOUT CONTRAST TECHNIQUE: Multiplanar, multiecho pulse sequences of the brain and surrounding structures were obtained without intravenous contrast. COMPARISON:  CT head without contrast 05/08/2020 FINDINGS: Brain: Moderate atrophy and advanced confluent white matter disease is present bilaterally. No acute infarct, hemorrhage, or mass lesion is present. Remote lacunar infarcts are present in the thalami bilaterally, more prominent on the right. A remote right paramedian lacunar infarct is present in the central pons. White matter changes are present bilaterally in the central pons. Remote lacunar infarcts are present in the centrum semi ovale, right greater than left. Cerebellum is unremarkable. The internal auditory canals are within normal limits. The ventricles are proportionate to the degree of  atrophy. No significant extraaxial fluid collection is present. Vascular: Flow is present in the major intracranial arteries. Skull and upper cervical spine: The craniocervical junction is normal. Upper cervical spine is within normal limits. Marrow signal is unremarkable. Sinuses/Orbits: Mucosal thickening is present along the inferior maxillary sinuses bilaterally. Bilateral mastoid effusions are present. No obstructing lesion is present. The paranasal sinuses and mastoid air cells are otherwise clear. Bilateral lens replacements are noted. Globes and orbits are  otherwise unremarkable. IMPRESSION: 1. No acute intracranial abnormality. 2. Moderate atrophy and advanced confluent white matter disease likely reflects the sequela of chronic microvascular ischemia. 3. 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 4. Bilateral mastoid effusions. No obstructing lesion is present. Electronically Signed   By: San Morelle M.D.   On: 05/09/2020 15:04   MR THORACIC SPINE WO CONTRAST  Result Date: 05/08/2020 CLINICAL DATA:  Back injury 2 days ago. Frequent falls over the last 2 weeks. Mid to low back pain. EXAM: MRI THORACIC AND LUMBAR SPINE WITHOUT CONTRAST TECHNIQUE: Multiplanar and multiecho pulse sequences of the thoracic and lumbar spine were obtained without intravenous contrast. COMPARISON:  Lumbar spine radiographs 05/08/2020. Cervical spine radiographs 01/29/2009 FINDINGS: MRI THORACIC SPINE FINDINGS Alignment:  Normal. Vertebrae: There is a chronic appearing T3 compression deformity with approximately 50% loss of vertebral body height and minimal retropulsion of the inferior endplate. There is no residual bone marrow edema. The posterior elements are intact. No acute fractures are seen within the thoracic spine. Cord: Normal in signal and caliber. Conus medullaris extends to the L2 level. Paraspinal and other soft tissues: No significant paraspinal findings. Small right greater than left dependent pleural effusions. Disc levels: As above, mild osseous retropulsion at T3-4 related to the chronic T3 compression deformity. No cord deformity or foraminal compromise. Mild disc bulging and endplate osteophyte formation within the lower thoracic spine without significant disc herniation, spinal stenosis or nerve root encroachment. MRI LUMBAR SPINE FINDINGS Segmentation:  There are 5 lumbar type vertebral bodies. Alignment:  Physiologic. Vertebrae: There is superior endplate irregularity at L1 with associated linear low signal and surrounding  marrow edema in the L1 vertebral body, suspicious for an acute compression deformity. There is also mild marrow edema within the anterior superior corner of the L2 vertebral body which could reflect an additional fracture or degenerative edema. The posterior elements are intact. There is no osseous retropulsion. Conus medullaris and cauda equina: Conus extends to the L2 level. Conus and cauda equina appear normal. Paraspinal and other soft tissues: Mild paraspinous edema anteriorly at L1, supporting an acute fracture. Disc levels: L1-2: Loss of disc height with mild disc bulging and facet hypertrophy. No spinal stenosis or nerve root encroachment. L2-3: No significant findings. L3-4: Mild loss of disc height with annular disc bulging and facet hypertrophy. No significant spinal stenosis or nerve root encroachment. L4-5: Mild loss of disc height with annular disc bulging, facet and ligamentous hypertrophy. Mild lateral recess and left foraminal narrowing without definite nerve root encroachment. L5-S1: Broad-based disc protrusion with covering spur in the right subarticular zone and medial foramen. This causes asymmetric narrowing of the right lateral recess and possible right S1 nerve root encroachment. In addition, there is mild right foraminal narrowing. Mild bilateral facet hypertrophy. IMPRESSION: 1. Probable acute mild superior endplate compression fracture at L1 with associated marrow edema. 2. Possible minimal superior endplate compression fracture at L2. 3. No acute findings in the thoracic spine. Old healed T3 compression deformity. 4. Lumbar spondylosis as  described with disc bulging, endplate osteophytes and facet hypertrophy. At L4-5, the lateral recesses and left foramen are mildly narrowed. At L5-S1, there is asymmetric right lateral recess and right foraminal narrowing. Electronically Signed   By: Richardean Sale M.D.   On: 05/08/2020 20:09   MR LUMBAR SPINE WO CONTRAST  Result Date:  05/08/2020 CLINICAL DATA:  Back injury 2 days ago. Frequent falls over the last 2 weeks. Mid to low back pain. EXAM: MRI THORACIC AND LUMBAR SPINE WITHOUT CONTRAST TECHNIQUE: Multiplanar and multiecho pulse sequences of the thoracic and lumbar spine were obtained without intravenous contrast. COMPARISON:  Lumbar spine radiographs 05/08/2020. Cervical spine radiographs 01/29/2009 FINDINGS: MRI THORACIC SPINE FINDINGS Alignment:  Normal. Vertebrae: There is a chronic appearing T3 compression deformity with approximately 50% loss of vertebral body height and minimal retropulsion of the inferior endplate. There is no residual bone marrow edema. The posterior elements are intact. No acute fractures are seen within the thoracic spine. Cord: Normal in signal and caliber. Conus medullaris extends to the L2 level. Paraspinal and other soft tissues: No significant paraspinal findings. Small right greater than left dependent pleural effusions. Disc levels: As above, mild osseous retropulsion at T3-4 related to the chronic T3 compression deformity. No cord deformity or foraminal compromise. Mild disc bulging and endplate osteophyte formation within the lower thoracic spine without significant disc herniation, spinal stenosis or nerve root encroachment. MRI LUMBAR SPINE FINDINGS Segmentation:  There are 5 lumbar type vertebral bodies. Alignment:  Physiologic. Vertebrae: There is superior endplate irregularity at L1 with associated linear low signal and surrounding marrow edema in the L1 vertebral body, suspicious for an acute compression deformity. There is also mild marrow edema within the anterior superior corner of the L2 vertebral body which could reflect an additional fracture or degenerative edema. The posterior elements are intact. There is no osseous retropulsion. Conus medullaris and cauda equina: Conus extends to the L2 level. Conus and cauda equina appear normal. Paraspinal and other soft tissues: Mild paraspinous  edema anteriorly at L1, supporting an acute fracture. Disc levels: L1-2: Loss of disc height with mild disc bulging and facet hypertrophy. No spinal stenosis or nerve root encroachment. L2-3: No significant findings. L3-4: Mild loss of disc height with annular disc bulging and facet hypertrophy. No significant spinal stenosis or nerve root encroachment. L4-5: Mild loss of disc height with annular disc bulging, facet and ligamentous hypertrophy. Mild lateral recess and left foraminal narrowing without definite nerve root encroachment. L5-S1: Broad-based disc protrusion with covering spur in the right subarticular zone and medial foramen. This causes asymmetric narrowing of the right lateral recess and possible right S1 nerve root encroachment. In addition, there is mild right foraminal narrowing. Mild bilateral facet hypertrophy. IMPRESSION: 1. Probable acute mild superior endplate compression fracture at L1 with associated marrow edema. 2. Possible minimal superior endplate compression fracture at L2. 3. No acute findings in the thoracic spine. Old healed T3 compression deformity. 4. Lumbar spondylosis as described with disc bulging, endplate osteophytes and facet hypertrophy. At L4-5, the lateral recesses and left foramen are mildly narrowed. At L5-S1, there is asymmetric right lateral recess and right foraminal narrowing. Electronically Signed   By: Richardean Sale M.D.   On: 05/08/2020 20:09   EEG adult  Result Date: 05/09/2020 Lora Havens, MD     05/09/2020 10:58 AM Patient Name: Searcy Ooten MRN: [id number] Epilepsy Attending: Lora Havens Referring Physician/Provider: Dr Irene Pap Date: 05/09/2020 Duration: 28.29 mins Patient history: 87yo M with ams.  EEG to evaluate for seizure Level of alertness: Awake AEDs during EEG study: LEV Technical aspects: This EEG study was done with scalp electrodes positioned according to the 10-20 International system of electrode placement. Electrical activity was  acquired at a sampling rate of 500Hz  and reviewed with a high frequency filter of 70Hz  and a low frequency filter of 1Hz . EEG data were recorded continuously and digitally stored. Description: The posterior dominant rhythm consists of 8 Hz activity of moderate voltage (25-35 uV) seen predominantly in posterior head regions, symmetric and reactive to eye opening and eye closing. EEG showed intermittent generalized 3 to 6 Hz theta-delta slowing. Hyperventilation and photic stimulation were not performed.   ABNORMALITY - Intermittent slow, generalized IMPRESSION: This study is suggestive of mild diffuse encephalopathy, nonspecific etiology. No seizures or epileptiform discharges were seen throughout the recording. Priyanka Barbra Sarks        Scheduled Meds: . aspirin EC  81 mg Oral Daily  . clonazePAM  1 mg Oral QHS  . darifenacin  7.5 mg Oral Daily  . enoxaparin (LOVENOX) injection  40 mg Subcutaneous Q24H  . ezetimibe  10 mg Oral Daily  . gabapentin  600 mg Oral TID  . isosorbide mononitrate  60 mg Oral Daily  . latanoprost  1 drop Both Eyes QHS  . levETIRAcetam  500 mg Oral BID  . loratadine  10 mg Oral Daily  . montelukast  10 mg Oral Daily  . pantoprazole  40 mg Oral Daily  . rosuvastatin  5 mg Oral Daily  . sodium chloride flush  10-40 mL Intracatheter Q12H  . tamsulosin  0.4 mg Oral Daily   Continuous Infusions: . lactated ringers 50 mL/hr at 05/10/20 0657          Aline August, MD [facility] 05/10/2020, 7:33 AM

## 2020-05-10 NOTE — Evaluation (Signed)
Physical Therapy Evaluation Patient Details Name: Theodore Jones MRN: [id number] DOB: Sep 05, 1931 Today's Date: 05/10/2020   History of Present Illness  Pt is a 87 y.o. M who presents with worsening confusion, LBP, multiple falls with generalized weakness and cognitive decline. UA negative for pyuria. CT negative for acute findings. EEG showed no evidence of seizures. MRI lumbar spine showing acute compression fracture of L1 and 2. Significant PMH: OSA, seizure disorder, prior MI, CAD, HTN.  Clinical Impression  Pt admitted with above. Pt son reports that in the past 4 weeks, pt has had a decline in mobility and cognition. Pt has been using a walker and requiring assist with ADL's. Pt presents with decreased functional mobility secondary to back pain, balance deficits, decreased cognition, generalized weakness and tremors. Pt ambulating x 30 feet with a walker with two person minimal assist. Pt son reports pt wife who is primarily with him will not be able to provide this level of assist; but would like pt to be reassessed following planned kyphoplasty. Will continue to follow and progress mobility as tolerated.     Follow Up Recommendations SNF    Equipment Recommendations  None recommended by PT (has needed DME)   Recommendations for Other Services       Precautions / Restrictions Precautions Precautions: Fall;Back Precaution Comments: Back precautions for comfort Restrictions Weight Bearing Restrictions: No      Mobility  Bed Mobility Overal bed mobility: Needs Assistance Bed Mobility: Rolling;Sidelying to Sit;Sit to Supine Rolling: Min assist Sidelying to sit: Min assist   Sit to supine: Mod assist   General bed mobility comments: MinA to roll onto left side, assist for trunk to upright. ModA for BLE negotiation back into bed    Transfers Overall transfer level: Needs assistance Equipment used: Rolling walker (2 wheeled) Transfers: Sit to/from Stand Sit to Stand: Min  assist         General transfer comment: MinA to rise and initially steady  Ambulation/Gait Ambulation/Gait assistance: Min assist;+2 physical assistance Gait Distance (Feet): 30 Feet Assistive device: Rolling walker (2 wheeled) Gait Pattern/deviations: Step-through pattern;Decreased stride length Gait velocity: decreased Gait velocity interpretation: <1.8 ft/sec, indicate of risk for recurrent falls General Gait Details: Cues for walker proximity, sequencing/direction. pt tremulous, requiring minA + 2 for safety and balance  Stairs            Wheelchair Mobility    Modified Rankin (Stroke Patients Only)       Balance Overall balance assessment: Needs assistance Sitting-balance support: Feet supported Sitting balance-Leahy Scale: Fair     Standing balance support: Bilateral upper extremity supported Standing balance-Leahy Scale: Poor Standing balance comment: reliant on external support                             Pertinent Vitals/Pain Pain Assessment: Faces Faces Pain Scale: Hurts little more Pain Location: back Pain Descriptors / Indicators: Grimacing;Guarding Pain Intervention(s): Limited activity within patient's tolerance;Monitored during session    Home Living Family/patient expects to be discharged to:: Private residence Living Arrangements: Spouse/significant other;Children (spouse, son) Available Help at Discharge: Family Type of Home: House Home Access: Stairs to enter   Technical brewer of Steps: 1 (deep threshold step) Home Layout: Able to live on main level with bedroom/bathroom Home Equipment: Walker - 2 wheels;Bedside commode;Wheelchair - manual      Prior Function Level of Independence: Needs assistance   Gait / Transfers Assistance Needed: Pt son reports "mobility worsening,"  past 4 weeks, using walker but often forgets  ADL's / Homemaking Assistance Needed: requiring assist  Comments: History of recent falls      Hand Dominance        Extremity/Trunk Assessment   Upper Extremity Assessment Upper Extremity Assessment: Generalized weakness (BUE tremor)    Lower Extremity Assessment Lower Extremity Assessment: Generalized weakness       Communication   Communication: HOH  Cognition Arousal/Alertness: Awake/alert Behavior During Therapy: WFL for tasks assessed/performed Overall Cognitive Status: Difficult to assess Area of Impairment: Awareness;Safety/judgement;Following commands;Memory                     Memory: Decreased short-term memory Following Commands: Follows one step commands consistently Safety/Judgement: Decreased awareness of safety;Decreased awareness of deficits Awareness: Intellectual   General Comments: Pt son reports change in cognition 4 weeks ago with increasing confusion. Pt not agitated or restless, very HOH, decreased awareness of safety/deficits      General Comments      Exercises     Assessment/Plan    PT Assessment Patient needs continued PT services  PT Problem List Decreased strength;Decreased activity tolerance;Decreased balance;Decreased mobility;Decreased cognition;Decreased safety awareness;Pain       PT Treatment Interventions DME instruction;Gait training;Functional mobility training;Therapeutic activities;Stair training;Therapeutic exercise;Balance training;Patient/family education    PT Goals (Current goals can be found in the Care Plan section)  Acute Rehab PT Goals Patient Stated Goal: pt son would like mobility and cognition to improve PT Goal Formulation: With patient/family Time For Goal Achievement: 05/24/20 Potential to Achieve Goals: Fair    Frequency Min 3X/week   Barriers to discharge        Co-evaluation               AM-PAC PT "6 Clicks" Mobility  Outcome Measure Help needed turning from your back to your side while in a flat bed without using bedrails?: A Little Help needed moving from lying on  your back to sitting on the side of a flat bed without using bedrails?: A Little Help needed moving to and from a bed to a chair (including a wheelchair)?: A Little Help needed standing up from a chair using your arms (e.g., wheelchair or bedside chair)?: A Little Help needed to walk in hospital room?: A Little Help needed climbing 3-5 steps with a railing? : A Lot 6 Click Score: 17    End of Session Equipment Utilized During Treatment: Gait belt Activity Tolerance: Patient tolerated treatment well Patient left: in bed;with call bell/phone within reach;with bed alarm set Nurse Communication: Mobility status PT Visit Diagnosis: Unsteadiness on feet (R26.81);Muscle weakness (generalized) (M62.81);History of falling (Z91.81);Difficulty in walking, not elsewhere classified (R26.2);Pain Pain - part of body:  (back)    Time: 1610-9604 PT Time Calculation (min) (ACUTE ONLY): 23 min   Charges:   PT Evaluation $PT Eval Moderate Complexity: 1 Mod PT Treatments $Gait Training: 8-22 mins        Wyona Almas, PT, DPT Acute Rehabilitation Services Pager [phone] Office [place] 05/10/2020, 4:55 PM

## 2020-05-11 MED ORDER — POLYVINYL ALCOHOL 1.4 % OP SOLN
1.0000 [drp] | Freq: Four times a day (QID) | OPHTHALMIC | Status: DC | PRN
  Administered 2020-05-11 – 2020-05-12 (×3): 1 [drp] via OPHTHALMIC
  Filled 2020-05-11: qty 15

## 2020-05-11 NOTE — Progress Notes (Signed)
Patient ID: Theodore Jones, male   DOB: [birth year], 87 y.o.   MRN: [id number]  PROGRESS NOTE    Theodore Jones  [id number] DOB: [birth year] DOA: 05/08/2020 PCP: Pcp, No   Brief Narrative:  87 y.o. male with medical history significant for OSA, on BiPAP followed by pulmonary, seizure disorder, previous MI, coronary artery disease, chronic constipation, essential hypertension, hyperlipidemia, GERD, who presented to [facility] ED from home due to worsening confusion, low back pain, and multiple falls along with generalized weakness and cognitive decline over the last 2 weeks.  On presentation, he was afebrile and lab studies were essentially unremarkable.  UA was negative for pyuria.  He was found to have compression fracture involving L1 and L2.  Assessment & Plan:   Acute metabolic encephalopathy, unclear etiology -Presented with worsening confusion and generalized weakness from home.  Work-up unrevealing so far including UA negative for pyuria and CT head negative for acute intracranial findings. -EEG showed no evidence of seizures. -Avoid narcotics.   -vitamin [id number], TSH, folic acid and ammonia levels normal -Monitor mental status.  Fall precautions. Son stated that mental status has progressively worsened over the last few weeks after a seizure episode.  -Neurology evaluation appreciated.  MRI of the brain is negative for acute stroke.  Acute compression fracture of L1 and L2 seen on MRI of lumbar spine presenting with lower back pain -IR consulted for vertebroplasty: Plan for L1 kyphoplasty this week.  Frequent falls -PT/OT eval.  Might need placement.  TOC consult  Seizure disorder -Neurology increased Keppra to 750 mg twice a day.  EEG was negative for seizures.  Essential hypertension -Blood pressure currently stable.  Continue isosorbide mononitrate.  Hyperlipidemia -Continue ezetimibe and rosuvastatin  BPH -Continue tamsulosin  GERD -Continue PPI  OSA on BiPAP  -resume  BiPAP nightly  Chronic anxiety  -Resume home Klonopin and gabapentin.  Might have to decrease dose of gabapentin  Ambulatory dysfunction with frequent falls -PT recommends SNF placement.  TOC consult.   DVT prophylaxis: Lovenox Code Status: DNR Family Communication: Son/Eric at bedside on 05/09/2020 Disposition Plan: Status is: Inpatient  Remains inpatient appropriate because:Inpatient level of care appropriate due to severity of illness   Dispo:  Patient From: Home  Planned Disposition: SNF  Medically stable for discharge: No    Consultants: IR/neurology  Procedures: EEG  Antimicrobials: None   Subjective: Patient seen and examined at bedside.  Poor historian, hard of hearing.  No overnight fever, vomiting, seizures reported. Objective: Vitals:   05/10/20 0430 05/10/20 1456 05/10/20 2119 05/11/20 0542  BP: 129/76  (!) 156/80 (!) 164/80  Pulse: 76 83 88 74  Resp: 20  18 17   Temp: 98.8 F (37.1 C)  98.2 F (36.8 C) 98.7 F (37.1 C)  TempSrc: Oral  Oral   SpO2: 95% 99% 99% 94%    Intake/Output Summary (Last 24 hours) at 05/11/2020 0827 Last data filed at 05/11/2020 0600 Gross per 24 hour  Intake 220 ml  Output --  Net 220 ml   There were no vitals filed for this visit.  Examination:  General exam: No distress.  On room air currently.  Looks chronically ill.  Elderly male lying in bed.   Respiratory system: Bilateral decreased breath sounds at bases, no wheezing Cardiovascular system: S1-S2 heard, rate controlled Gastrointestinal system: Abdomen is mildly distended, soft and nontender.  Normal bowel sounds heard  extremities: No cyanosis; trace lower extremity edema present Central nervous system: Sleepy, wakes up slightly, extremely slow to respond.  Extremely poor historian.  No focal neurological deficits.  Moving extremities Skin: No obvious petechiae/lesions  psychiatry: Extremely flat affect after waking up.  Does not participate in conversation  much   Data Reviewed: I have personally reviewed following labs and imaging studies  CBC: Recent Labs  Lab 05/08/20 1129 05/09/20 0315 05/10/20 0013  WBC 8.7 6.9 9.5  NEUTROABS  --   --  6.3  HGB 14.8 14.1 15.1  HCT 45.4 43.7 45.2  MCV 89.4 90.5 87.4  PLT 159 170 811   Basic Metabolic Panel: Recent Labs  Lab 05/08/20 1129 05/09/20 0315 05/10/20 0013  NA 135 138 134*  K 4.3 3.8 3.9  CL 104 106 100  CO2 23 26 24   GLUCOSE 115* 112* 105*  BUN 15 13 14   CREATININE 0.97 0.87 0.92  CALCIUM 9.3 8.8* 8.9  MG  --  2.1 1.9  PHOS  --  3.6  --    GFR: CrCl cannot be calculated (Unknown ideal weight.). Liver Function Tests: Recent Labs  Lab 05/08/20 1129 05/10/20 0013  AST 22 22  ALT 13 12  ALKPHOS 42 45  BILITOT 0.8 1.3*  PROT 7.1 6.7  ALBUMIN 3.6 3.5   No results for input(s): LIPASE, AMYLASE in the last 168 hours. Recent Labs  Lab 05/10/20 0013  AMMONIA 27   Coagulation Profile: Recent Labs  Lab 05/10/20 0013  INR 1.1   Cardiac Enzymes: No results for input(s): CKTOTAL, CKMB, CKMBINDEX, TROPONINI in the last 168 hours. BNP (last 3 results) No results for input(s): PROBNP in the last 8760 hours. HbA1C: No results for input(s): HGBA1C in the last 72 hours. CBG: No results for input(s): GLUCAP in the last 168 hours. Lipid Profile: No results for input(s): CHOL, HDL, LDLCALC, TRIG, CHOLHDL, LDLDIRECT in the last 72 hours. Thyroid Function Tests: Recent Labs    05/10/20 0034  TSH 1.604   Anemia Panel: Recent Labs    05/10/20 0013 05/10/20 0034  VITAMINB12 494  --   FOLATE  --  28.9   Sepsis Labs: No results for input(s): PROCALCITON, LATICACIDVEN in the last 168 hours.  Recent Results (from the past 240 hour(s))  Resp Panel by RT-PCR (Flu A&B, Covid) Nasopharyngeal Swab     Status: None   Collection Time: 05/08/20 10:46 PM   Specimen: Nasopharyngeal Swab; Nasopharyngeal(NP) swabs in vial transport medium  Result Value Ref Range Status    SARS Coronavirus 2 by RT PCR NEGATIVE NEGATIVE Final    Comment: (NOTE) SARS-CoV-2 target nucleic acids are NOT DETECTED.  The SARS-CoV-2 RNA is generally detectable in upper respiratory specimens during the acute phase of infection. The lowest concentration of SARS-CoV-2 viral copies this assay can detect is 138 copies/mL. A negative result does not preclude SARS-Cov-2 infection and should not be used as the sole basis for treatment or other patient management decisions. A negative result may occur with  improper specimen collection/handling, submission of specimen other than nasopharyngeal swab, presence of viral mutation(s) within the areas targeted by this assay, and inadequate number of viral copies(<138 copies/mL). A negative result must be combined with clinical observations, patient history, and epidemiological information. The expected result is Negative.  Fact Sheet for Patients:  EntrepreneurPulse.com.au  Fact Sheet for Healthcare Providers:  IncredibleEmployment.be  This test is no t yet approved or cleared by the [place] FDA and  has been authorized for detection and/or diagnosis of SARS-CoV-2 by FDA under an Emergency Use Authorization (EUA). This EUA will remain  in  effect (meaning this test can be used) for the duration of the COVID-19 declaration under Section 564(b)(1) of the Act, 21 U.S.C.section 360bbb-3(b)(1), unless the authorization is terminated  or revoked sooner.       Influenza A by PCR NEGATIVE NEGATIVE Final   Influenza B by PCR NEGATIVE NEGATIVE Final    Comment: (NOTE) The Xpert Xpress SARS-CoV-2/FLU/RSV plus assay is intended as an aid in the diagnosis of influenza from Nasopharyngeal swab specimens and should not be used as a sole basis for treatment. Nasal washings and aspirates are unacceptable for Xpert Xpress SARS-CoV-2/FLU/RSV testing.  Fact Sheet for  Patients: EntrepreneurPulse.com.au  Fact Sheet for Healthcare Providers: IncredibleEmployment.be  This test is not yet approved or cleared by the [place] FDA and has been authorized for detection and/or diagnosis of SARS-CoV-2 by FDA under an Emergency Use Authorization (EUA). This EUA will remain in effect (meaning this test can be used) for the duration of the COVID-19 declaration under Section 564(b)(1) of the Act, 21 U.S.C. section 360bbb-3(b)(1), unless the authorization is terminated or revoked.  Performed at [facility], [place] [address] [place], [place] [address]          Radiology Studies: MR BRAIN WO CONTRAST  Result Date: 05/09/2020 CLINICAL DATA:  Neuro deficit, acute, stroke suspected. New onset of progressive confusion and weakness. Seizures. EXAM: MRI HEAD WITHOUT CONTRAST TECHNIQUE: Multiplanar, multiecho pulse sequences of the brain and surrounding structures were obtained without intravenous contrast. COMPARISON:  CT head without contrast 05/08/2020 FINDINGS: Brain: Moderate atrophy and advanced confluent white matter disease is present bilaterally. No acute infarct, hemorrhage, or mass lesion is present. Remote lacunar infarcts are present in the thalami bilaterally, more prominent on the right. A remote right paramedian lacunar infarct is present in the central pons. White matter changes are present bilaterally in the central pons. Remote lacunar infarcts are present in the centrum semi ovale, right greater than left. Cerebellum is unremarkable. The internal auditory canals are within normal limits. The ventricles are proportionate to the degree of atrophy. No significant extraaxial fluid collection is present. Vascular: Flow is present in the major intracranial arteries. Skull and upper cervical spine: The craniocervical junction is normal. Upper cervical spine is within normal limits. Marrow signal is unremarkable.  Sinuses/Orbits: Mucosal thickening is present along the inferior maxillary sinuses bilaterally. Bilateral mastoid effusions are present. No obstructing lesion is present. The paranasal sinuses and mastoid air cells are otherwise clear. Bilateral lens replacements are noted. Globes and orbits are otherwise unremarkable. IMPRESSION: 1. No acute intracranial abnormality. 2. Moderate atrophy and advanced confluent white matter disease likely reflects the sequela of chronic microvascular ischemia. 3. 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 4. Bilateral mastoid effusions. No obstructing lesion is present. Electronically Signed   By: San Morelle M.D.   On: 05/09/2020 15:04   EEG adult  Result Date: 05/09/2020 Lora Havens, MD     05/09/2020 10:58 AM Patient Name: Stephone Gum MRN: [id number] Epilepsy Attending: Lora Havens Referring Physician/Provider: Dr Irene Pap Date: 05/09/2020 Duration: 28.29 mins Patient history: 87yo M with ams. EEG to evaluate for seizure Level of alertness: Awake AEDs during EEG study: LEV Technical aspects: This EEG study was done with scalp electrodes positioned according to the 10-20 International system of electrode placement. Electrical activity was acquired at a sampling rate of 500Hz  and reviewed with a high frequency filter of 70Hz  and a low frequency filter of 1Hz . EEG data were recorded  continuously and digitally stored. Description: The posterior dominant rhythm consists of 8 Hz activity of moderate voltage (25-35 uV) seen predominantly in posterior head regions, symmetric and reactive to eye opening and eye closing. EEG showed intermittent generalized 3 to 6 Hz theta-delta slowing. Hyperventilation and photic stimulation were not performed.   ABNORMALITY - Intermittent slow, generalized IMPRESSION: This study is suggestive of mild diffuse encephalopathy, nonspecific etiology. No seizures or epileptiform discharges were seen  throughout the recording. Priyanka Barbra Sarks        Scheduled Meds: . aspirin EC  81 mg Oral Daily  . clonazePAM  1 mg Oral QHS  . darifenacin  7.5 mg Oral Daily  . enoxaparin (LOVENOX) injection  40 mg Subcutaneous Q24H  . ezetimibe  10 mg Oral Daily  . gabapentin  600 mg Oral TID  . isosorbide mononitrate  60 mg Oral Daily  . latanoprost  1 drop Both Eyes QHS  . levETIRAcetam  750 mg Oral BID  . loratadine  10 mg Oral Daily  . LORazepam  0.5 mg Oral Once  . montelukast  10 mg Oral Daily  . pantoprazole  40 mg Oral Daily  . rosuvastatin  5 mg Oral Daily  . sodium chloride flush  10-40 mL Intracatheter Q12H  . tamsulosin  0.4 mg Oral Daily   Continuous Infusions:         Aline August, MD [facility] 05/11/2020, 8:27 AM

## 2020-05-11 NOTE — Evaluation (Signed)
Occupational Therapy Evaluation Patient Details Name: Theodore Jones MRN: [id number] DOB: [birth year] Today's Date: 05/11/2020    History of Present Illness Pt is a 87 y.o. M who presents with worsening confusion, LBP, multiple falls with generalized weakness and cognitive decline. UA negative for pyuria. CT negative for acute findings. EEG showed no evidence of seizures. MRI lumbar spine showing acute compression fracture of L1 and 2. Significant PMH: OSA, seizure disorder, prior MI, CAD, HTN.   Clinical Impression   Pt admitted to ED for concerns listed above. PTA pt lived with his son and wife, uses a RW for functional mobility, and has supervision for all ADL's. During evaluation, pt required min-mod verbal cueing for initiation and sequencing with all functional mobility and ADL performance. Pt is pleasantly confused. He needs min assist to come to standing and sup-min guard for functional mobility. Acute OT will continue to follow to work on progressing OT goals and concerns listed below.    Follow Up Recommendations  SNF    Equipment Recommendations  Other (comment) (TBD)    Recommendations for Other Services       Precautions / Restrictions Precautions Precautions: Fall;Back Precaution Comments: Back precautions for comfort Restrictions Weight Bearing Restrictions: No      Mobility Bed Mobility Overal bed mobility: Needs Assistance Bed Mobility: Sidelying to Sit Rolling: Min assist Sidelying to sit: Min assist       General bed mobility comments: MinA to roll onto left side, assist for trunk to upright.    Transfers Overall transfer level: Needs assistance Equipment used: Rolling walker (2 wheeled) Transfers: Sit to/from Stand Sit to Stand: Min assist         General transfer comment: MinA to rise and initially steady    Balance Overall balance assessment: Needs assistance Sitting-balance support: Feet supported Sitting balance-Leahy Scale: Fair      Standing balance support: Bilateral upper extremity supported Standing balance-Leahy Scale: Poor Standing balance comment: reliant on external support                           ADL either performed or assessed with clinical judgement   ADL Overall ADL's : Needs assistance/impaired Eating/Feeding: Set up;Sitting Eating/Feeding Details (indicate cue type and reason): Pt needs containers open and food set up in front of him Grooming: Wash/dry hands;Wash/dry face;Min guard;Cueing for sequencing;Sitting Grooming Details (indicate cue type and reason): Min verbal cueing for intiation, supervision for safety Upper Body Bathing: Min guard;Sitting   Lower Body Bathing: Moderate assistance;Cueing for safety;Cueing for sequencing;Cueing for compensatory techniques;Cueing for back precautions;Sitting/lateral leans;Sit to/from stand Lower Body Bathing Details (indicate cue type and reason): Pt needs assist due to back pain and cueing for safety, initiation, and sequencing. Upper Body Dressing : Set up;Sitting Upper Body Dressing Details (indicate cue type and reason): donned a clean hospital gown Lower Body Dressing: Maximal assistance;Sitting/lateral leans;Sit to/from stand Lower Body Dressing Details (indicate cue type and reason): Unable to reach down and don/doff socks at this time, can assist with pulling pants up as needed. Toilet Transfer: Minimal Production assistant, radio Details (indicate cue type and reason): Pt needs min assist to power up to standing, verbal cueing for sequencing and safety to pivot and transfer. Toileting- Clothing Manipulation and Hygiene: Moderate assistance;Cueing for sequencing;Cueing for safety;Cueing for back precautions;Sitting/lateral lean;Sit to/from stand Toileting - Clothing Manipulation Details (indicate cue type and reason): needs assistance for thoroughness and safety Tub/ Shower Transfer: Minimal assistance;Stand-pivot Electrical engineer Details (  indicate cue type and reason): Needs assistance to power up to standing and to pivot. Functional mobility during ADLs: Minimal assistance;Rolling walker General ADL Comments: Pt needs assistance with all functional mobility to due to cognitive deficits, balance concerns, and pain. ADL's, especially with LE are limited due to back precautions, pain, and balance deficits.     Vision Baseline Vision/History: Glaucoma;Macular Degeneration Patient Visual Report: No change from baseline Additional Comments: Visual deficits at baseline.     Perception Perception Perception Tested?: No   Praxis Praxis Praxis tested?: Not tested    Pertinent Vitals/Pain Pain Assessment: No/denies pain     Hand Dominance Right   Extremity/Trunk Assessment Upper Extremity Assessment Upper Extremity Assessment: Generalized weakness (BUE tremor)   Lower Extremity Assessment Lower Extremity Assessment: Defer to PT evaluation   Cervical / Trunk Assessment Cervical / Trunk Assessment: Normal   Communication Communication Communication: HOH   Cognition Arousal/Alertness: Awake/alert Behavior During Therapy: WFL for tasks assessed/performed Overall Cognitive Status: History of cognitive impairments - at baseline Area of Impairment: Awareness;Safety/judgement;Following commands;Memory                     Memory: Decreased short-term memory Following Commands: Follows one step commands consistently Safety/Judgement: Decreased awareness of safety;Decreased awareness of deficits Awareness: Intellectual   General Comments: Pt daughter reports change in cognition 4 weeks ago with increasing confusion. Pt not agitated or restless, very HOH, decreased awareness of safety/deficits. Pt having mild confusion/memory deficits at baseline with intermittent agitation.   General Comments  VSS on RA    Exercises     Shoulder Instructions      Home Living Family/patient expects to be  discharged to:: Private residence Living Arrangements: Spouse/significant other;Children Available Help at Discharge: [place] Type of Home: House Home Access: Stairs to enter Technical brewer of Steps: 1   Home Layout: Able to live on main level with bedroom/bathroom     Bathroom Shower/Tub: Other (comment) (walk in bathtub)   Bathroom Toilet: Handicapped height Bathroom Accessibility: Yes How Accessible: Accessible via wheelchair Home Equipment: [place] - 2 wheels;Bedside commode;Wheelchair - manual          Prior Functioning/Environment Level of Independence: Needs assistance  Gait / Transfers Assistance Needed: Pt son reports "mobility worsening," past 4 weeks, using walker but often forgets ADL's / Homemaking Assistance Needed: requiring assist   Comments: History of recent falls        OT Problem List: Decreased strength;Decreased activity tolerance;Impaired balance (sitting and/or standing);Decreased coordination;Decreased cognition;Decreased safety awareness;Decreased knowledge of use of DME or AE;Decreased knowledge of precautions;Pain      OT Treatment/Interventions: Self-care/ADL training;Therapeutic exercise;Energy conservation;DME and/or AE instruction;Therapeutic activities;Cognitive remediation/compensation;Patient/family education;Balance training    OT Goals(Current goals can be found in the care plan section) Acute Rehab OT Goals Patient Stated Goal: Family want mobility and cognition to improve. OT Goal Formulation: With patient/family Time For Goal Achievement: 05/25/20 Potential to Achieve Goals: Good ADL Goals Pt Will Perform Grooming: with set-up;with supervision;standing Pt Will Perform Upper Body Bathing: with set-up;sitting Pt Will Perform Upper Body Dressing: with set-up;with supervision;sitting Pt Will Transfer to Toilet: with supervision;stand pivot transfer;bedside commode  OT Frequency: Min 2X/week   Barriers to D/C:             Co-evaluation              AM-PAC OT "6 Clicks" Daily Activity     Outcome Measure Help from another person eating meals?: A Little Help from another  person taking care of personal grooming?: A Little Help from another person toileting, which includes using toliet, bedpan, or urinal?: A Lot Help from another person bathing (including washing, rinsing, drying)?: A Lot Help from another person to put on and taking off regular upper body clothing?: A Little Help from another person to put on and taking off regular lower body clothing?: A Lot 6 Click Score: 15   End of Session Equipment Utilized During Treatment: Gait belt;Rolling walker Nurse Communication: Mobility status  Activity Tolerance: Patient tolerated treatment well Patient left: in chair;with call bell/phone within reach;with chair alarm set;with nursing/sitter in room;with family/visitor present  OT Visit Diagnosis: Unsteadiness on feet (R26.81);Muscle weakness (generalized) (M62.81);Repeated falls (R29.6)                Time: 1610-9604 OT Time Calculation (min): 28 min Charges:  OT General Charges $OT Visit: 1 Visit OT Evaluation $OT Eval Moderate Complexity: 1 Mod OT Treatments $Self Care/Home Management : 8-22 mins  Stephenson Cichy H., OTR/L Acute Rehabilitation  Maleeah Crossman Elane Skylar Flynt 05/11/2020, 2:11 PM

## 2020-05-11 NOTE — Progress Notes (Signed)
IR requested by Dr. Nevada Crane for possible image-guided kyphoplasty/vertebroplasty.  Case/images have been reviewed by Dr. Laurence Ferrari who approved patient for L1 kyphoplasy/vertebroplasty (please see consult note from Jannifer Franklin, PA-C 05/09/2020 for further evaluation). Case/images re-reviewed by Dr. Karenann Cai.  Patient has been seen/consented for procedure. Insurance authorization approved this AM. Plan for possible image-guided L1 kyphoplasty/vertebroplasty in IR tentatively for tomorrow 05/12/2020 with Dr. Karenann Cai pending IR scheduling. Patient will be NPO at midnight. Hold Lovenox at this time.  Please call IR with questions/concerns.   Bea Graff Kimaya Whitlatch, PA-C 05/11/2020, 9:17 AM

## 2020-05-11 NOTE — NC FL2 (Addendum)
[place] LEVEL OF CARE SCREENING TOOL     IDENTIFICATION  Patient Name: Theodore Jones Birthdate: Apr 02, 1931 Sex: male Admission Date (Current Location): 05/08/2020  [facility] and [place] Number:  Herbalist and Address:  [facility]. [facility], [place] [address] [place], [place] [address]      Provider Number: [id number]  Attending Physician Name and Address:  Aline August, MD  Relative Name and Phone Number:  son Rahkim Rabalais [phone]    Current Level of Care: Hospital Recommended Level of Care: [place] Prior Approval Number:    Date Approved/Denied:   PASRR Number: [id number]  Discharge Plan: SNF    Current Diagnoses: Patient Active Problem List   Diagnosis Date Noted  . AMS (altered mental status) 05/08/2020    Orientation RESPIRATION BLADDER Height & Weight     Self,Situation  Normal Continent Weight:   Height:     BEHAVIORAL SYMPTOMS/MOOD NEUROLOGICAL BOWEL NUTRITION STATUS    Convulsions/Seizures (HX seizure disorder, on Keppra) Continent Diet  AMBULATORY STATUS COMMUNICATION OF NEEDS Skin   Extensive Assist Verbally Normal                       Personal Care Assistance Level of Assistance  Bathing,Feeding,Dressing Bathing Assistance: Maximum assistance Feeding assistance: Limited assistance Dressing Assistance: Limited assistance     Functional Limitations Info  Sight,Hearing Sight Info: Adequate Hearing Info: Adequate      SPECIAL CARE FACTORS FREQUENCY  PT (By licensed PT),OT (By licensed OT)     PT Frequency: 5 times a week OT Frequency: 5 times a week            Contractures Contractures Info: Not present    Additional Factors Info  Code Status,Allergies Code Status Info: DNR Allergies Info: No known allergies  Plan 05/12/20 kyphoplasty / vertebroplasty            Current Medications (05/11/2020):  This is the current hospital active medication list Current  Facility-Administered Medications  Medication Dose Route Frequency Provider Last Rate Last Admin  . acetaminophen (TYLENOL) tablet 650 mg  650 mg Oral Q6H PRN Irene Pap N, DO      . aspirin EC tablet 81 mg  81 mg Oral Daily Irene Pap N, DO   81 mg at 05/11/20 1047  . clonazePAM (KLONOPIN) tablet 1 mg  1 mg Oral QHS Hall, Carole N, DO   1 mg at 05/10/20 2127  . darifenacin (ENABLEX) 24 hr tablet 7.5 mg  7.5 mg Oral Daily Hall, Carole N, DO   7.5 mg at 05/11/20 1047  . enoxaparin (LOVENOX) injection 40 mg  40 mg Subcutaneous Q24H Irene Pap N, DO   40 mg at 05/10/20 2127  . ezetimibe (ZETIA) tablet 10 mg  10 mg Oral Daily Irene Pap N, DO   10 mg at 05/11/20 1047  . gabapentin (NEURONTIN) capsule 600 mg  600 mg Oral TID Irene Pap N, DO   600 mg at 05/11/20 1047  . isosorbide mononitrate (IMDUR) 24 hr tablet 60 mg  60 mg Oral Daily Hall, Carole N, DO   60 mg at 05/11/20 1046  . latanoprost (XALATAN) 0.005 % ophthalmic solution 1 drop  1 drop Both Eyes QHS Hall, Carole N, DO   1 drop at 05/10/20 2358  . levETIRAcetam (KEPPRA) tablet 750 mg  750 mg Oral BID Kerney Elbe, MD   750 mg at 05/11/20 1047  . loratadine (CLARITIN)  tablet 10 mg  10 mg Oral Daily Irene Pap N, DO   10 mg at 05/11/20 1046  . LORazepam (ATIVAN) tablet 0.5 mg  0.5 mg Oral Once Alekh, Kshitiz, MD      . montelukast (SINGULAIR) tablet 10 mg  10 mg Oral Daily [place], Carole N, DO   10 mg at 05/11/20 1046  . pantoprazole (PROTONIX) EC tablet 40 mg  40 mg Oral Daily Irene Pap N, DO   40 mg at 05/11/20 1046  . polyethylene glycol (MIRALAX / GLYCOLAX) packet 17 g  17 g Oral Daily PRN Irene Pap N, DO      . polyvinyl alcohol (LIQUIFILM TEARS) 1.4 % ophthalmic solution 1 drop  1 drop Both Eyes QID PRN Alekh, Kshitiz, MD      . rosuvastatin (CRESTOR) tablet 5 mg  5 mg Oral Daily Hall, Carole N, DO   5 mg at 05/11/20 1047  . sodium chloride flush (NS) 0.9 % injection 10-40 mL  10-40 mL Intracatheter Q12H Hall, Carole N,  DO      . sodium chloride flush (NS) 0.9 % injection 10-40 mL  10-40 mL Intracatheter PRN Irene Pap N, DO      . tamsulosin (FLOMAX) capsule 0.4 mg  0.4 mg Oral Daily Irene Pap N, DO   0.4 mg at 05/11/20 1046     Discharge Medications: Please see discharge summary for a list of discharge medications.  Relevant Imaging Results:  Relevant Lab Results:   Additional Information [place] 02/18/19 and 01/16/19  Marilu Favre, RN

## 2020-05-11 NOTE — Plan of Care (Signed)
  Problem: Education: Goal: Knowledge of General Education information will improve Description Including pain rating scale, medication(s)/side effects and non-pharmacologic comfort measures Outcome: Progressing   

## 2020-05-11 NOTE — Care Management Important Message (Signed)
Important Message  Patient Details  Name: Theodore Jones MRN: [id number] Date of Birth: 23-May-1931   Medicare Important Message Given:  Yes     Orbie Pyo 05/11/2020, 3:30 PM

## 2020-05-11 NOTE — TOC Initial Note (Signed)
Transition of Care [facility]) - Initial/Assessment Note    Patient Details  Name: Theodore Jones MRN: [id number] Date of Birth: 02-15-1931  [facility] of [facility]) CM/SW Contact:    Marilu Favre, RN Phone Number: 05/11/2020, 3:19 PM  Clinical Narrative:                  Patient asleep. AMS documented. Called patient's son Theodore Jones [phone].  Discussed PT recommendations for SNF. Theodore Jones is in agreement with SNF. Will provide bed offers when available.  Expected Discharge Plan: [place]     Patient Goals and CMS Choice        Expected Discharge Plan and Services Expected Discharge Plan: [place]   Discharge Planning Services: CM Consult   Living arrangements for the past 2 months: Single Family Home                 DME Arranged: N/A DME Agency: NA       HH Arranged: NA          Prior Living Arrangements/Services Living arrangements for the past 2 months: Single Family Home Lives with:: Spouse Patient language and need for interpreter reviewed:: Yes            Current home services: DME    Activities of Daily Living      Permission Sought/Granted                  Emotional Assessment              Admission diagnosis:  Fall, initial encounter [W19.XXXA] AMS (altered mental status) [R41.82] Closed compression fracture of L1 vertebra, initial encounter ([place]) [Z12.458K] Patient Active Problem List   Diagnosis Date Noted  . AMS (altered mental status) 05/08/2020   PCP:  Pcp, No Pharmacy:   [facility] [place] [place] [place] [address] Phone: [phone] Fax: [phone]     Social Determinants of Health (SDOH) Interventions    Readmission Risk Interventions No flowsheet data found.

## 2020-05-12 ENCOUNTER — Encounter (HOSPITAL_COMMUNITY): Payer: Self-pay | Admitting: Internal Medicine

## 2020-05-12 ENCOUNTER — Inpatient Hospital Stay (HOSPITAL_COMMUNITY): Payer: Medicare Other

## 2020-05-12 HISTORY — PX: IR KYPHO LUMBAR INC FX REDUCE BONE BX UNI/BIL CANNULATION INC/IMAGING: IMG5519

## 2020-05-12 MED ORDER — LIDOCAINE HCL (PF) 1 % IJ SOLN
INTRAMUSCULAR | Status: AC | PRN
  Administered 2020-05-12: 10 mL

## 2020-05-12 MED ORDER — LIDOCAINE HCL 1 % IJ SOLN
INTRAMUSCULAR | Status: AC
  Filled 2020-05-12: qty 20

## 2020-05-12 MED ORDER — MIDAZOLAM HCL 2 MG/2ML IJ SOLN
INTRAMUSCULAR | Status: AC | PRN
  Administered 2020-05-12: 1 mg via INTRAVENOUS

## 2020-05-12 MED ORDER — MIDAZOLAM HCL 2 MG/2ML IJ SOLN
INTRAMUSCULAR | Status: AC
  Filled 2020-05-12: qty 2

## 2020-05-12 MED ORDER — FENTANYL CITRATE (PF) 100 MCG/2ML IJ SOLN
INTRAMUSCULAR | Status: AC
  Filled 2020-05-12: qty 2

## 2020-05-12 MED ORDER — CEFAZOLIN SODIUM-DEXTROSE 2-4 GM/100ML-% IV SOLN
2.0000 g | Freq: Once | INTRAVENOUS | Status: AC
  Administered 2020-05-12: 2 g via INTRAVENOUS
  Filled 2020-05-12: qty 100

## 2020-05-12 MED ORDER — BUPIVACAINE HCL (PF) 0.5 % IJ SOLN
INTRAMUSCULAR | Status: AC
  Administered 2020-05-12: 10 mL
  Filled 2020-05-12: qty 30

## 2020-05-12 MED ORDER — FENTANYL CITRATE (PF) 100 MCG/2ML IJ SOLN
INTRAMUSCULAR | Status: AC | PRN
  Administered 2020-05-12: 50 ug via INTRAVENOUS

## 2020-05-12 MED ORDER — LIDOCAINE HCL (PF) 1 % IJ SOLN
INTRAMUSCULAR | Status: AC
  Filled 2020-05-12: qty 30

## 2020-05-12 MED ORDER — IOHEXOL 300 MG/ML  SOLN: 50.0000 mL | Freq: Once | INTRAMUSCULAR | Status: DC | PRN

## 2020-05-12 MED ORDER — ENOXAPARIN SODIUM 40 MG/0.4ML IJ SOSY
40.0000 mg | PREFILLED_SYRINGE | Freq: Every day | INTRAMUSCULAR | Status: DC
  Administered 2020-05-13 – 2020-05-14 (×2): 40 mg via SUBCUTANEOUS
  Filled 2020-05-12 (×2): qty 0.4

## 2020-05-12 NOTE — Procedures (Signed)
INTERVENTIONAL NEURORADIOLOGY BRIEF POSTPROCEDURE NOTE  FLUOROSCOPY GUIDED L1 CORE BONE BIOPSY AND KYPHOPLASTY  Attending: Dr. Pedro Earls  Assistant: None.  Diagnosis: L1 compression fracture  Access site: Percutaneous transpedicular  Anesthesia: Moderate sedation  Medication used: 1 mg Versed IV; 50 mcg Fentanyl IV.  Complications: None.  Estimated blood loss: Negligible.  Specimen: L1 core biopsy  Findings: Compression fracture of the L1 superior endplate. Bilateral transpedicular approach used for balloon kyphoplasty.   The patient tolerated the procedure well without incident or complication and is in stable condition.

## 2020-05-12 NOTE — Plan of Care (Signed)
Pt back from procedure, no discomfort reported at this time.

## 2020-05-12 NOTE — TOC Progression Note (Addendum)
Transition of Care [facility]) - Progression Note    Patient Details  Name: Theodore Jones MRN: [id number] Date of Birth: 03-27-1931  [facility]) CM/SW Contact  Jacalyn Lefevre Edson Snowball, RN Phone Number: 05/12/2020, 12:07 PM  Clinical Narrative:     Provided patient with bed offers. Left medicare.gov list with ratings in patient's room. Called son [name] and Patent attorney. Awaiting call back. Eric returned call. Offers given over phone and emailed to [email or web address].  Misty at [employer] reviewing referral. Will update [name] when [place] makes determination.   Expected Discharge Plan: [place]    Expected Discharge Plan and Services Expected Discharge Plan: [place]   Discharge Planning Services: CM Consult   Living arrangements for the past 2 months: Single Family Home                 DME Arranged: N/A DME Agency: NA       HH Arranged: NA           Social Determinants of Health (SDOH) Interventions    Readmission Risk Interventions No flowsheet data found.

## 2020-05-12 NOTE — Sedation Documentation (Signed)
Attempted to call report to 6N, no nurse available for report. Left my number to call back.

## 2020-05-12 NOTE — Progress Notes (Signed)
Patient ID: Theodore Jones, male   DOB: 1931-05-07, 87 y.o.   MRN: [id number]  PROGRESS NOTE    Dwight Adamczak  KGM:[id number] DOB: June 25, 1931 DOA: 05/08/2020 PCP: Pcp, No   Brief Narrative:  87 y.o. male with medical history significant for OSA, on BiPAP followed by pulmonary, seizure disorder, previous MI, coronary artery disease, chronic constipation, essential hypertension, hyperlipidemia, GERD, who presented to [facility] ED from home due to worsening confusion, low back pain, and multiple falls along with generalized weakness and cognitive decline over the last 2 weeks.  On presentation, he was afebrile and lab studies were essentially unremarkable.  UA was negative for pyuria.  He was found to have compression fracture involving L1 and L2.  IR was consulted.  Neurology was also consulted for altered mental status.  MRI of the brain was negative for acute stroke.  EEG was negative for acute seizures.  Assessment & Plan:   Acute metabolic encephalopathy, unclear etiology -Presented with worsening confusion and generalized weakness from home.  Work-up unrevealing so far including UA negative for pyuria and CT head negative for acute intracranial findings. -EEG showed no evidence of seizures. -Avoid narcotics.   -vitamin U44, TSH, folic acid and ammonia levels normal -Monitor mental status.  Fall precautions. Son stated that mental status has progressively worsened over the last few weeks after a seizure episode.  -Neurology evaluation appreciated.  MRI of the brain was negative for acute stroke.  Acute compression fracture of L1 and L2 seen on MRI of lumbar spine presenting with lower back pain -IR consulted for vertebroplasty: Plan for L1 kyphoplasty today.  Frequent falls -PT/OT recommend SNF placement.  TOC following.  Seizure disorder -Neurology increased Keppra to 750 mg twice a day.  EEG was negative for seizures.  Essential hypertension -Blood pressure currently stable.  Continue  isosorbide mononitrate.  Hyperlipidemia -Continue ezetimibe and rosuvastatin  BPH -Continue tamsulosin  GERD -Continue PPI  OSA on BiPAP  -resume BiPAP nightly  Chronic anxiety  -Continue home Klonopin and gabapentin.    Ambulatory dysfunction with frequent falls -PT recommends SNF placement.  TOC following.   DVT prophylaxis: Lovenox Code Status: DNR Family Communication: Son/Eric at bedside on 05/09/2020 Disposition Plan: Status is: Inpatient  Remains inpatient appropriate because:Inpatient level of care appropriate due to severity of illness   Dispo:  Patient From: Home  Planned Disposition: SNF  Medically stable for discharge: No    Consultants: IR/neurology  Procedures: EEG  Antimicrobials: None   Subjective: Patient seen and examined at bedside.  Poor historian, hard of hearing.  No overnight vomiting, seizures, fever reported per nursing staff.    Objective: Vitals:   05/10/20 2119 05/11/20 0542 05/11/20 1949 05/12/20 0557  BP: (!) 156/80 (!) 164/80 (!) 145/52 (!) 155/85  Pulse: 88 74 82 83  Resp: 18 17 17 17   Temp: 98.2 F (36.8 C) 98.7 F (37.1 C) 97.6 F (36.4 C) 97.7 F (36.5 C)  TempSrc: Oral     SpO2: 99% 94% 99% 98%    Intake/Output Summary (Last 24 hours) at 05/12/2020 0800 Last data filed at 05/12/2020 0411 Gross per 24 hour  Intake 720 ml  Output 3200 ml  Net -2480 ml   There were no vitals filed for this visit.  Examination:  General exam: Currently on room air.  No acute distress.  Looks chronically ill.  Elderly male lying in bed.  Very poor historian, hardly participates in any conversation. Respiratory system: Decreased breath sounds at bases bilaterally Cardiovascular system:  Rate controlled, S1-S2 heard Gastrointestinal system: Abdomen is distended mildly, soft and nontender.  Bowel sounds are heard  extremities: Mild lower extremity edema present; no clubbing   Data Reviewed: I have personally reviewed following labs  and imaging studies  CBC: Recent Labs  Lab 05/08/20 1129 05/09/20 0315 05/10/20 0013  WBC 8.7 6.9 9.5  NEUTROABS  --   --  6.3  HGB 14.8 14.1 15.1  HCT 45.4 43.7 45.2  MCV 89.4 90.5 87.4  PLT 159 170 098   Basic Metabolic Panel: Recent Labs  Lab 05/08/20 1129 05/09/20 0315 05/10/20 0013  NA 135 138 134*  K 4.3 3.8 3.9  CL 104 106 100  CO2 23 26 24   GLUCOSE 115* 112* 105*  BUN 15 13 14   CREATININE 0.97 0.87 0.92  CALCIUM 9.3 8.8* 8.9  MG  --  2.1 1.9  PHOS  --  3.6  --    GFR: CrCl cannot be calculated (Unknown ideal weight.). Liver Function Tests: Recent Labs  Lab 05/08/20 1129 05/10/20 0013  AST 22 22  ALT 13 12  ALKPHOS 42 45  BILITOT 0.8 1.3*  PROT 7.1 6.7  ALBUMIN 3.6 3.5   No results for input(s): LIPASE, AMYLASE in the last 168 hours. Recent Labs  Lab 05/10/20 0013  AMMONIA 27   Coagulation Profile: Recent Labs  Lab 05/10/20 0013  INR 1.1   Cardiac Enzymes: No results for input(s): CKTOTAL, CKMB, CKMBINDEX, TROPONINI in the last 168 hours. BNP (last 3 results) No results for input(s): PROBNP in the last 8760 hours. HbA1C: No results for input(s): HGBA1C in the last 72 hours. CBG: No results for input(s): GLUCAP in the last 168 hours. Lipid Profile: No results for input(s): CHOL, HDL, LDLCALC, TRIG, CHOLHDL, LDLDIRECT in the last 72 hours. Thyroid Function Tests: Recent Labs    05/10/20 0034  TSH 1.604   Anemia Panel: Recent Labs    05/10/20 0013 05/10/20 0034  VITAMINB12 494  --   FOLATE  --  28.9   Sepsis Labs: No results for input(s): PROCALCITON, LATICACIDVEN in the last 168 hours.  Recent Results (from the past 240 hour(s))  Resp Panel by RT-PCR (Flu A&B, Covid) Nasopharyngeal Swab     Status: None   Collection Time: 05/08/20 10:46 PM   Specimen: Nasopharyngeal Swab; Nasopharyngeal(NP) swabs in vial transport medium  Result Value Ref Range Status   SARS Coronavirus 2 by RT PCR NEGATIVE NEGATIVE Final    Comment:  (NOTE) SARS-CoV-2 target nucleic acids are NOT DETECTED.  The SARS-CoV-2 RNA is generally detectable in upper respiratory specimens during the acute phase of infection. The lowest concentration of SARS-CoV-2 viral copies this assay can detect is 138 copies/mL. A negative result does not preclude SARS-Cov-2 infection and should not be used as the sole basis for treatment or other patient management decisions. A negative result may occur with  improper specimen collection/handling, submission of specimen other than nasopharyngeal swab, presence of viral mutation(s) within the areas targeted by this assay, and inadequate number of viral copies(<138 copies/mL). A negative result must be combined with clinical observations, patient history, and epidemiological information. The expected result is Negative.  Fact Sheet for Patients:  EntrepreneurPulse.com.au  Fact Sheet for Healthcare Providers:  IncredibleEmployment.be  This test is no t yet approved or cleared by the [place] FDA and  has been authorized for detection and/or diagnosis of SARS-CoV-2 by FDA under an Emergency Use Authorization (EUA). This EUA will remain  in effect (meaning this  test can be used) for the duration of the COVID-19 declaration under Section 564(b)(1) of the Act, 21 U.S.C.section 360bbb-3(b)(1), unless the authorization is terminated  or revoked sooner.       Influenza A by PCR NEGATIVE NEGATIVE Final   Influenza B by PCR NEGATIVE NEGATIVE Final    Comment: (NOTE) The Xpert Xpress SARS-CoV-2/FLU/RSV plus assay is intended as an aid in the diagnosis of influenza from Nasopharyngeal swab specimens and should not be used as a sole basis for treatment. Nasal washings and aspirates are unacceptable for Xpert Xpress SARS-CoV-2/FLU/RSV testing.  Fact Sheet for Patients: EntrepreneurPulse.com.au  Fact Sheet for Healthcare  Providers: IncredibleEmployment.be  This test is not yet approved or cleared by the [place] FDA and has been authorized for detection and/or diagnosis of SARS-CoV-2 by FDA under an Emergency Use Authorization (EUA). This EUA will remain in effect (meaning this test can be used) for the duration of the COVID-19 declaration under Section 564(b)(1) of the Act, 21 U.S.C. section 360bbb-3(b)(1), unless the authorization is terminated or revoked.  Performed at [facility], [place] [address], [place], [place] [address]          Radiology Studies: No results found.      Scheduled Meds: . aspirin EC  81 mg Oral Daily  . clonazePAM  1 mg Oral QHS  . darifenacin  7.5 mg Oral Daily  . enoxaparin (LOVENOX) injection  40 mg Subcutaneous Q24H  . ezetimibe  10 mg Oral Daily  . gabapentin  600 mg Oral TID  . isosorbide mononitrate  60 mg Oral Daily  . latanoprost  1 drop Both Eyes QHS  . levETIRAcetam  750 mg Oral BID  . loratadine  10 mg Oral Daily  . LORazepam  0.5 mg Oral Once  . montelukast  10 mg Oral Daily  . pantoprazole  40 mg Oral Daily  . rosuvastatin  5 mg Oral Daily  . sodium chloride flush  10-40 mL Intracatheter Q12H  . tamsulosin  0.4 mg Oral Daily   Continuous Infusions:         Aline August, MD [facility] 05/12/2020, 8:00 AM

## 2020-05-12 NOTE — Progress Notes (Signed)
Physical Therapy Treatment Patient Details Name: Theodore Jones MRN: [id number] DOB: 06-22-1931 Today's Date: 05/12/2020    History of Present Illness Pt is a 87 y.o. M who presents with worsening confusion, LBP, multiple falls with generalized weakness and cognitive decline. UA negative for pyuria. CT negative for acute findings. EEG showed no evidence of seizures. MRI lumbar spine showing acute compression fracture of L1 and 2. S/p L1 kyphoplasty 5/10. Significant PMH: OSA, seizure disorder, prior MI, CAD, HTN.    PT Comments    Pt assessed s/p kyphoplasty. Displays some regression towards his physical therapy goals. Requiring min-mod assist for functional mobility. Able to stand x 2 from edge of bed to a walker, however, requesting to sit back down and then lie down. Pt unable to give a reason for inability to walk I.e. pain, fatigue, etc. Will continue to progress mobility as tolerated. Continue to recommend SNF for ongoing Physical Therapy.      Follow Up Recommendations  SNF     Equipment Recommendations  None recommended by PT    Recommendations for Other Services       Precautions / Restrictions Precautions Precautions: Fall;Back Precaution Comments: Back precautions for comfort Restrictions Weight Bearing Restrictions: No    Mobility  Bed Mobility Overal bed mobility: Needs Assistance Bed Mobility: Rolling;Sidelying to Sit;Sit to Sidelying Rolling: Min assist Sidelying to sit: Min assist     Sit to sidelying: Mod assist General bed mobility comments: Use of bed pad to guide hips into sidelying position, trunk assist to upright to edge of bed. Assist for BLE elevation back into bed    Transfers Overall transfer level: Needs assistance Equipment used: Rolling walker (2 wheeled) Transfers: Sit to/from Stand Sit to Stand: Mod assist         General transfer comment: ModA to rise from edge of bed x 2  Ambulation/Gait                 Stairs              Wheelchair Mobility    Modified Rankin (Stroke Patients Only)       Balance Overall balance assessment: Needs assistance Sitting-balance support: Feet supported Sitting balance-Leahy Scale: Fair     Standing balance support: Bilateral upper extremity supported Standing balance-Leahy Scale: Poor Standing balance comment: reliant on external support                            Cognition Arousal/Alertness: Awake/alert Behavior During Therapy: WFL for tasks assessed/performed Overall Cognitive Status: History of cognitive impairments - at baseline Area of Impairment: Awareness;Safety/judgement;Following commands;Memory                     Memory: Decreased short-term memory Following Commands: Follows one step commands consistently Safety/Judgement: Decreased awareness of safety;Decreased awareness of deficits Awareness: Intellectual   General Comments: Pt asking about PT occupation, if I enjoy it, how many hours I work etc. Attempted ambulation today, but pt stating, "I need to sit." Could not verbalize reason for needing to sit i.e. pain, fatigue. Very HOH, family reporting change in cognition 4 weeks ago with increased confusion.      Exercises      General Comments        Pertinent Vitals/Pain Pain Assessment: Faces Faces Pain Scale: Hurts even more Pain Location: back Pain Descriptors / Indicators: Grimacing;Guarding Pain Intervention(s): Limited activity within patient's tolerance;Monitored during session;Repositioned  Home Living                      Prior Function            PT Goals (current goals can now be found in the care plan section) Acute Rehab PT Goals Patient Stated Goal: Family want mobility and cognition to improve. Potential to Achieve Goals: Fair    Frequency    Min 3X/week      PT Plan Current plan remains appropriate    Co-evaluation              AM-PAC PT "6 Clicks" Mobility    Outcome Measure  Help needed turning from your back to your side while in a flat bed without using bedrails?: A Little Help needed moving from lying on your back to sitting on the side of a flat bed without using bedrails?: A Little Help needed moving to and from a bed to a chair (including a wheelchair)?: A Lot Help needed standing up from a chair using your arms (e.g., wheelchair or bedside chair)?: A Lot Help needed to walk in hospital room?: A Lot Help needed climbing 3-5 steps with a railing? : Total 6 Click Score: 13    End of Session Equipment Utilized During Treatment: Gait belt Activity Tolerance: Patient limited by pain Patient left: in bed;with call bell/phone within reach;with bed alarm set Nurse Communication: Mobility status PT Visit Diagnosis: Unsteadiness on feet (R26.81);Muscle weakness (generalized) (M62.81);History of falling (Z91.81);Difficulty in walking, not elsewhere classified (R26.2);Pain     Time: 1414-1440 PT Time Calculation (min) (ACUTE ONLY): 26 min  Charges:  $Therapeutic Activity: 23-37 mins                     Wyona Almas, PT, DPT Acute Rehabilitation Services Pager [phone] Office [place] 05/12/2020, 4:33 PM

## 2020-05-12 NOTE — Sedation Documentation (Signed)
Report given to [facility], RN on 6N. Patient's cell phone and hearing aid (in pink denture cup) on bed with patient for transport.

## 2020-05-13 LAB — CBC WITH DIFFERENTIAL/PLATELET
Abs Immature Granulocytes: 0.04 10*3/uL (ref 0.00–0.07)
Basophils Absolute: 0 10*3/uL (ref 0.0–0.1)
Basophils Relative: 1 %
Eosinophils Absolute: 0.4 10*3/uL (ref 0.0–0.5)
Eosinophils Relative: 4 %
HCT: 40.8 % (ref 39.0–52.0)
Hemoglobin: 13.7 g/dL (ref 13.0–17.0)
Immature Granulocytes: 1 %
Lymphocytes Relative: 16 %
Lymphs Abs: 1.4 10*3/uL (ref 0.7–4.0)
MCH: 29.1 pg (ref 26.0–34.0)
MCHC: 33.6 g/dL (ref 30.0–36.0)
MCV: 86.6 fL (ref 80.0–100.0)
Monocytes Absolute: 0.9 10*3/uL (ref 0.1–1.0)
Monocytes Relative: 10 %
Neutro Abs: 6 10*3/uL (ref 1.7–7.7)
Neutrophils Relative %: 68 %
Platelets: 192 10*3/uL (ref 150–400)
RBC: 4.71 MIL/uL (ref 4.22–5.81)
RDW: 13.7 % (ref 11.5–15.5)
WBC: 8.7 10*3/uL (ref 4.0–10.5)
nRBC: 0 % (ref 0.0–0.2)

## 2020-05-13 LAB — BASIC METABOLIC PANEL
Anion gap: 10 (ref 5–15)
BUN: 16 mg/dL (ref 8–23)
CO2: 24 mmol/L (ref 22–32)
Calcium: 8.3 mg/dL — ABNORMAL LOW (ref 8.9–10.3)
Chloride: 100 mmol/L (ref 98–111)
Creatinine, Ser: 0.99 mg/dL (ref 0.61–1.24)
GFR, Estimated: >60 mL/min (ref >60)
Glucose, Bld: 129 mg/dL — ABNORMAL HIGH (ref 70–99)
Potassium: 3.9 mmol/L (ref 3.5–5.1)
Sodium: 134 mmol/L — ABNORMAL LOW (ref 135–145)

## 2020-05-13 LAB — MAGNESIUM: Magnesium: 2 mg/dL (ref 1.7–2.4)

## 2020-05-13 LAB — SURGICAL PATHOLOGY

## 2020-05-13 MED ORDER — LEVETIRACETAM 750 MG PO TABS: 750.0000 mg | ORAL_TABLET | Freq: Two times a day (BID) | ORAL | 2 refills | Status: AC

## 2020-05-13 MED ORDER — CLONAZEPAM 1 MG PO TABS: 1.0000 mg | ORAL_TABLET | Freq: Every day | ORAL | 0 refills | Status: AC

## 2020-05-13 NOTE — Progress Notes (Signed)
PROGRESS NOTE    LUPE BONNER  DZH:[id number] DOB: August 06, 1931 DOA: 05/08/2020 PCP: Pcp, No    Brief Narrative:  87 year old gentleman with history of obstructive sleep apnea on BiPAP, seizure disorder on Keppra, previous MI, chronic constipation, essential hypertension, hyperlipidemia and GERD brought to the emergency room due to worsening confusion, low back pain and multiple falls at home along with generalized weakness and cognitive decline for about 2 weeks.  On presentation he was hemodynamically stable.  UA was negative for pyuria.  He was found to have acute compression fracture of L1 and L2.  Followed by neurology for altered mental status.  IR was consulted and underwent kyphoplasty on 5/10.   Assessment & Plan:   Active Problems:   AMS (altered mental status)  Acute metabolic encephalopathy, unclear cause.  Probably multifactorial. Presented with worsening confusion and generalized weakness.  This is a recurring event.  Currently stabilized. EEG showed no evidence of seizures. Initial CT scan and subsequent MRI of the brain was negative for any acute findings. Urinalysis including cultures were negative for presence of any infection. M19, TSH and folic acid and ammonia levels were normal. Neurology recommended conservative management.  All-time fall precautions and delirium precautions.  Avoiding narcotics.  Acute compression fracture L1 and L2: Underwent L1 balloon kyphoplasty on 5/10 by interventional etiology.  Uneventful.  Currently on Tylenol for pain relief.  Seizure disorder: Patient was on Keppra 500 mg twice a day before coming to the hospital.  With altered mental status, neurology recommended increased dose of Keppra to 750 mg twice a day.  Essential hypertension: Currently stable on isosorbide mononitrate.  Hyperlipidemia: On Zetia and rosuvastatin.  Continue.  OSA on BiPAP: Using nightly.  Anxiety: Stable on nighttime Klonopin and  gabapentin.  Ambulatory dysfunction and deconditioning: Will benefit with skilled nursing facility rehab.  Waiting for placement.    DVT prophylaxis: enoxaparin (LOVENOX) injection 40 mg Start: 05/13/20 1000 SCDs Start: 05/08/20 2040   Code Status: DNR Family Communication: Patient's son at the bedside Disposition Plan: Status is: Inpatient  Remains inpatient appropriate because:Unsafe d/c plan   Dispo:  Patient From: Home  Planned Disposition: [place]  Medically stable for discharge: Yes          Consultants:   Neurology  IR  Procedures:   L1, balloon kyphoplasty 5/10  Antimicrobials:   None   Subjective: Patient seen and examined.  He denies any complaints.  Hard of hearing.  Pleasantly sitting in chair and tells me that he feels fine.  Patient's son was at the bedside and tells me that his mentation is improving but not back to baseline.  Objective: Vitals:   05/12/20 1445 05/12/20 2137 05/13/20 0616 05/13/20 1317  BP: (!) 161/80 (!) 160/70 (!) 152/57 (!) 164/66  Pulse: 88 96 64 (!) 43  Resp: 15 19 19 18   Temp: 98.2 F (36.8 C) 97.9 F (36.6 C) 98.7 F (37.1 C) 98.4 F (36.9 C)  TempSrc: Oral Oral Oral Oral  SpO2: 96% 98% 95% 97%    Intake/Output Summary (Last 24 hours) at 05/13/2020 1338 Last data filed at 05/13/2020 1014 Gross per 24 hour  Intake 300 ml  Output --  Net 300 ml   There were no vitals filed for this visit.  Examination:  General exam: Appears calm and comfortable  Chronically sick looking but not in any distress.  Comfortable and quiet sitting in couch. Respiratory system: Clear to auscultation. Respiratory effort normal. Cardiovascular system: S1 & S2  heard, RRR. No JVD, murmurs, rubs, gallops or clicks. No pedal edema. Gastrointestinal system: Soft and nontender. Central nervous system: Alert and oriented x1.  No focal deficit.  Generalized weakness.    Data Reviewed: I have personally reviewed  following labs and imaging studies  CBC: Recent Labs  Lab 05/08/20 1129 05/09/20 0315 05/10/20 0013 05/13/20 0413  WBC 8.7 6.9 9.5 8.7  NEUTROABS  --   --  6.3 6.0  HGB 14.8 14.1 15.1 13.7  HCT 45.4 43.7 45.2 40.8  MCV 89.4 90.5 87.4 86.6  PLT 159 170 167 419   Basic Metabolic Panel: Recent Labs  Lab 05/08/20 1129 05/09/20 0315 05/10/20 0013 05/13/20 0413  NA 135 138 134* 134*  K 4.3 3.8 3.9 3.9  CL 104 106 100 100  CO2 23 26 24 24   GLUCOSE 115* 112* 105* 129*  BUN 15 13 14 16   CREATININE 0.97 0.87 0.92 0.99  CALCIUM 9.3 8.8* 8.9 8.3*  MG  --  2.1 1.9 2.0  PHOS  --  3.6  --   --    GFR: CrCl cannot be calculated (Unknown ideal weight.). Liver Function Tests: Recent Labs  Lab 05/08/20 1129 05/10/20 0013  AST 22 22  ALT 13 12  ALKPHOS 42 45  BILITOT 0.8 1.3*  PROT 7.1 6.7  ALBUMIN 3.6 3.5   No results for input(s): LIPASE, AMYLASE in the last 168 hours. Recent Labs  Lab 05/10/20 0013  AMMONIA 27   Coagulation Profile: Recent Labs  Lab 05/10/20 0013  INR 1.1   Cardiac Enzymes: No results for input(s): CKTOTAL, CKMB, CKMBINDEX, TROPONINI in the last 168 hours. BNP (last 3 results) No results for input(s): PROBNP in the last 8760 hours. HbA1C: No results for input(s): HGBA1C in the last 72 hours. CBG: No results for input(s): GLUCAP in the last 168 hours. Lipid Profile: No results for input(s): CHOL, HDL, LDLCALC, TRIG, CHOLHDL, LDLDIRECT in the last 72 hours. Thyroid Function Tests: No results for input(s): TSH, T4TOTAL, FREET4, T3FREE, THYROIDAB in the last 72 hours. Anemia Panel: No results for input(s): VITAMINB12, FOLATE, FERRITIN, TIBC, IRON, RETICCTPCT in the last 72 hours. Sepsis Labs: No results for input(s): PROCALCITON, LATICACIDVEN in the last 168 hours.  Recent Results (from the past 240 hour(s))  Resp Panel by RT-PCR (Flu A&B, Covid) Nasopharyngeal Swab     Status: None   Collection Time: 05/08/20 10:46 PM   Specimen:  Nasopharyngeal Swab; Nasopharyngeal(NP) swabs in vial transport medium  Result Value Ref Range Status   SARS Coronavirus 2 by RT PCR NEGATIVE NEGATIVE Final    Comment: (NOTE) SARS-CoV-2 target nucleic acids are NOT DETECTED.  The SARS-CoV-2 RNA is generally detectable in upper respiratory specimens during the acute phase of infection. The lowest concentration of SARS-CoV-2 viral copies this assay can detect is 138 copies/mL. A negative result does not preclude SARS-Cov-2 infection and should not be used as the sole basis for treatment or other patient management decisions. A negative result may occur with  improper specimen collection/handling, submission of specimen other than nasopharyngeal swab, presence of viral mutation(s) within the areas targeted by this assay, and inadequate number of viral copies(<138 copies/mL). A negative result must be combined with clinical observations, patient history, and epidemiological information. The expected result is Negative.  Fact Sheet for Patients:  EntrepreneurPulse.com.au  Fact Sheet for Healthcare Providers:  IncredibleEmployment.be  This test is no t yet approved or cleared by the [place] FDA and  has been authorized for detection  and/or diagnosis of SARS-CoV-2 by FDA under an Emergency Use Authorization (EUA). This EUA will remain  in effect (meaning this test can be used) for the duration of the COVID-19 declaration under Section 564(b)(1) of the Act, 21 U.S.C.section 360bbb-3(b)(1), unless the authorization is terminated  or revoked sooner.       Influenza A by PCR NEGATIVE NEGATIVE Final   Influenza B by PCR NEGATIVE NEGATIVE Final    Comment: (NOTE) The Xpert Xpress SARS-CoV-2/FLU/RSV plus assay is intended as an aid in the diagnosis of influenza from Nasopharyngeal swab specimens and should not be used as a sole basis for treatment. Nasal washings and aspirates are unacceptable for  Xpert Xpress SARS-CoV-2/FLU/RSV testing.  Fact Sheet for Patients: EntrepreneurPulse.com.au  Fact Sheet for Healthcare Providers: IncredibleEmployment.be  This test is not yet approved or cleared by the [place] FDA and has been authorized for detection and/or diagnosis of SARS-CoV-2 by FDA under an Emergency Use Authorization (EUA). This EUA will remain in effect (meaning this test can be used) for the duration of the COVID-19 declaration under Section 564(b)(1) of the Act, 21 U.S.C. section 360bbb-3(b)(1), unless the authorization is terminated or revoked.  Performed at [facility], [place] [address], [place], [place] [address]          Radiology Studies: IR KYPHO LUMBAR INC FX REDUCE BONE BX UNI/BIL CANNULATION INC/IMAGING  Result Date: 05/12/2020 INDICATION: 87 year old male with past medical history significant for obstructive sleep apnea, seizure, CAD, hypertension, hyperlipidemia admitted recently for altered mental status and severe low back pain in the setting of recent fall. MRI of the lumbar spine showed an acute compression fracture of the L1 vertebral body. He comes to our service today for a fluoroscopy guided kyphoplasty. EXAM: FLUOROSCOPY GUIDED L1 CORE BONE BIOPSY AND BILOMA KYPHOPLASTY COMPARISON:  MRI of the lumbar spine May 08, 2020. MEDICATIONS: As antibiotic prophylaxis, Ancef 2 gm IV. ANESTHESIA/SEDATION: Moderate (conscious) sedation was employed during this procedure. A total of Versed 1 mg and Fentanyl 50 mcg was administered intravenously. Moderate Sedation Time: 56 minutes. The patient's level of consciousness and vital signs were monitored continuously by radiology nursing throughout the procedure under my direct supervision. FLUOROSCOPY TIME:  Fluoroscopy Time: 21 minutes 48 seconds (0000000 mGy) COMPLICATIONS: None immediate. PROCEDURE: Following a full explanation of the procedure along with the potential associated  complications, an informed witnessed consent was obtained from patient's son. The patient was placed in prone position on the angiography table. The lumbar spine region was prepped and draped in a sterile fashion. Under fluoroscopy, the L1 vertebral body was delineated and the skin area was marked. The skin was infiltrated with a 1% Lidocaine approximately 3.2 cm lateral to the spinous process projection on the right. Using a 22-gauge spinal needle, the soft issue and the peripedicular space and periosteum were infiltrated with Bupivacaine 0.5%. A skin incision was made at the access site. Subsequently, an 11-gauge Kyphon trocar was inserted under fluoroscopic guidance until contact with the pedicle was obtained. The trocar was inserted under light hammer tapping into the pedicle until the posterior boundaries of the vertebral body was reached. The diamond mandrill was removed and one core biopsy wasobtained. The skin was infiltrated with a 1% Lidocaine approximately 3.2 cm lateral to the spinous process projection on the left. Using a 22-gauge spinal needle, the soft issue and the peripedicular space and periosteum were infiltrated with Bupivacaine 0.5%. A skin incision was made at the access site. Subsequently, an 11-gauge Kyphon trocar  was inserted under fluoroscopic guidance until contact with the pedicle was obtained. The trocar was inserted under light hammer tapping into the pedicle until the posterior boundaries of the vertebral body was reached. The diamond mandrill was removed. A bone drill was coaxially advanced within the anterior third of the vertebral body and then exchanged for inflatable Kyphon balloons. These were centered within the mid-aspect of the vertebral body. The balloons were inflated to create a void to serve as a repository for the bone cement. Both balloons were deflated and through both cannulas, under continuous fluoroscopy guidance in the AP and lateral views, the vertebral body was  filled with previously mixed polymethyl-methacrylate (PMMA) added to barium for opacification. Both cannulas were later removed. Minimal cement extravasation into the T12-L1 disc space seen. No epidural venous contamination was seen. The access sites were cleaned and covered with a sterile bandage. IMPRESSION: Successful fluoroscopy-guided bilateral transpedicular approach for L1 vertebral body core bone biopsy and kyphoplasty for treatment of osteoporotic fragility fracture. Bone samples obtained were sent for pathology analysis. Electronically Signed   By: Pedro Earls M.D.   On: 05/12/2020 11:42        Scheduled Meds: . aspirin EC  81 mg Oral Daily  . clonazePAM  1 mg Oral QHS  . darifenacin  7.5 mg Oral Daily  . enoxaparin (LOVENOX) injection  40 mg Subcutaneous Daily  . ezetimibe  10 mg Oral Daily  . gabapentin  600 mg Oral TID  . isosorbide mononitrate  60 mg Oral Daily  . latanoprost  1 drop Both Eyes QHS  . levETIRAcetam  750 mg Oral BID  . loratadine  10 mg Oral Daily  . montelukast  10 mg Oral Daily  . pantoprazole  40 mg Oral Daily  . rosuvastatin  5 mg Oral Daily  . sodium chloride flush  10-40 mL Intracatheter Q12H  . tamsulosin  0.4 mg Oral Daily   Continuous Infusions:   LOS: 5 days    Time spent: 34 minutes    Barb Merino, MD [facility] Pager [phone]

## 2020-05-13 NOTE — Progress Notes (Addendum)
NEUROLOGY CONSULTATION PROGRESS NOTE   Date of service: May 13, 2020 Patient Name: Theodore Jones MRN:  [id number] DOB:  [birth year]  Brief HPI  [name] is a 87 y.o. male with PMH significant for seizure disorder, hypertension, coronary artery disease, right ICA stent placed in February 2018, hyperlipidemia, remote MI, Hodgkin lymphoma s/p chemotherapy, and GERD who presented to the ED from home on 5/6 for evaluation of progressive confusion, low back pain, multiple falls, and generalized weakness since last seizure that occurred approximately 2 weeks prior to current hospitalization. Patient has a history of epilepsy on Keppra 500 mg BID. Spine imaging concerning for L1 - L2 vertebral fractures with L1 associated marrow edema.CT head without acute abnormality, but with generalized cerebral atrophy noted.  Initial exam with poor orientation but fluent speech with intact comprehension. Oriented to self butwith incomplete orientation to time, place, or situation. He haspain with lower extremity antigravity movement, mildcogwheel rigidity of bilateral upper extremities, andupper extremitytremors with restwhich become more pronounced withaction.  Workup with MRI of thoracic and lumbar spine with probable acute mild superior endplate compression fracture at L1 with associated marrow edema, possible minimal superior endplate compression fracture at L2, and lumbar spondylosis. MRI brain with no acute intracranial abnormality. Remote lacunar infarcts of the thalami and centrum semi ovale bilaterally and with moderate atrophy and advanced confluent white matter disease.  Routine EEG with mild diffuse encephalopathy, nonspecifictoetiology.No seizures or epileptiform discharges were seen throughout the recording.  Kyphoplasty was done on 5/10 and Keppra was increased to 724m BID for unprovoked seizure 2 weeks prior to admission.  Interval Hx  No acute overnight events Case manager  is working on SNF placement with family Per Dr. GNena Alexandernote 5/11 with son at bedside- son reports improving mentation but not completely back to baseline  Vitals   Vitals:   05/12/20 1030 05/12/20 1445 05/12/20 2137 05/13/20 0616  BP: 108/61 (!) 161/80 (!) 160/70 (!) 152/57  Pulse: 70 88 96 64  Resp: 14 15 19 19   Temp:  98.2 F (36.8 C) 97.9 F (36.6 C) 98.7 F (37.1 C)  TempSrc:  Oral Oral Oral  SpO2: 95% 96% 98% 95%    There is no height or weight on file to calculate BMI.  Physical Exam   General: Sleeping initially, laying comfortably in bed; in no acute distress.  HENT: Normal oropharynx and mucosa. Normal external appearance of ears and nose. Patient is extremely hard of hearing. Neck: Supple, no pain or tenderness CV: No JVD. No peripheral edema. Pulmonary: Symmetric Chest rise. Normal respiratory effort. Abdomen: Soft to touch, non-tender.  Ext: No cyanosis, mild lower extremity edema, no obvious deformity Skin: No rash. Normal palpation of skin.     Neurologic Examination  Mental status/Cognition: Drowsy, oriented to self. He is not completely oriented to place, month and year. [place] that he is in the hospital, events leading to hospitalization, does not report the month or his age correctly. Poor attention noted. Speech/language: Speech is without dysarthria.  Cranial nerves:   CN II Pupils equal and reactive to light, no VF deficits   CN III,IV,VI EOM intact, no gaze preference or deviation, no nystagmus    CN V Facial sensation intact and symmetric to light touch   CN VII No asymmetry, no nasolabial fold flattening    CN VIII Patient is extremely hard of hearing at baseline despite hearing aids  CN IX & X Nnormal palatal elevation, no uvular deviation    CN XI 5/5 head turn and 5/5 shoulder shrug bilaterally    CN XII Midline tongue protrusion    Motor:Bilateral upper extremities with 4+/5  strength. Bilateral lower extremities with some antigravity movement from the hip without asymmetry. BLE pain-limited assessment due to increased lower back pain with antigravity movement worse on the LLE. Bilateral ankles 5/5 strength. Low amplitude tremor noted at rest and with action of bilateral upper extremities and head. Tone is increased throughout with mild cogwheel rigidity in bilateral upper extremities.Bulk is normal.  Sensation:Intact and symmetrictolight touch bilaterally in upper and lower extremities.  Coordination: FTNwith slow movement but without ataxia. HKSunable to be assessed due to increased lower back pain with lower extremity antigravity movement.  DTRs:1+ left patellae, right patellae with trace reflex.  Gait:Deferred  Labs   Basic Metabolic Panel:  Lab Results  Component Value Date   NA 134 (L) 05/13/2020   K 3.9 05/13/2020   CO2 24 05/13/2020   GLUCOSE 129 (H) 05/13/2020   BUN 16 05/13/2020   CREATININE 0.99 05/13/2020   CALCIUM 8.3 (L) 05/13/2020   GFRNONAA >60 05/13/2020   GFRAA  12/29/2008    >60        The eGFR has been calculated using the MDRD equation. This calculation has not been validated in all clinical situations. eGFR's persistently <60 mL/min signify possible Chronic Kidney Disease.   CBC    Component Value Date/Time   WBC 8.7 05/13/2020 0413   RBC 4.71 05/13/2020 0413   HGB 13.7 05/13/2020 0413   HCT 40.8 05/13/2020 0413   PLT 192 05/13/2020 0413   MCV 86.6 05/13/2020 0413   MCH 29.1 05/13/2020 0413   MCHC 33.6 05/13/2020 0413   RDW 13.7 05/13/2020 0413   LYMPHSABS 1.4 05/13/2020 0413   MONOABS 0.9 05/13/2020 0413   EOSABS 0.4 05/13/2020 0413   BASOSABS 0.0 05/13/2020 0413   HbA1c: No results found for: HGBA1C LDL: No results found for: [facility] Urine Drug Screen: No results found for: LABOPIA, COCAINSCRNUR, LABBENZ, AMPHETMU, THCU, LABBARB  Alcohol Level No results found for: ETH No results found for: PHENYTOIN,  ZONISAMIDE, LAMOTRIGINE, LEVETIRACETA No results found for: PHENYTOIN, PHENOBARB, VALPROATE, CBMZ  Imaging and Diagnostic studies  Results for orders placed during the hospital encounter of 05/08/20  MR BRAIN WO CONTRAST  Narrative CLINICAL DATA:  Neuro deficit, acute, stroke suspected. New onset of progressive confusion and weakness. Seizures.  EXAM: MRI HEAD WITHOUT CONTRAST  TECHNIQUE: Multiplanar, multiecho pulse sequences of the brain and surrounding structures were obtained without intravenous contrast.  COMPARISON:  CT head without contrast 05/08/2020  FINDINGS: Brain: Moderate atrophy and advanced confluent white matter disease is present bilaterally. No acute infarct, hemorrhage, or mass lesion is present. Remote lacunar infarcts are present in the thalami bilaterally, more prominent on the right. A remote right paramedian lacunar infarct is present in the central pons. White matter changes are present bilaterally in the central pons. Remote lacunar infarcts are present in the centrum semi ovale, right greater than left. Cerebellum is unremarkable.  The internal auditory canals are within normal limits.  The ventricles are proportionate to the degree of atrophy. No significant extraaxial fluid collection is present.  Vascular: Flow is present in the major intracranial arteries.  Skull and upper cervical spine: The craniocervical junction is normal. Upper cervical spine is within normal limits. Marrow signal is unremarkable.  Sinuses/Orbits: Mucosal thickening is present along the inferior  maxillary sinuses bilaterally. Bilateral mastoid effusions are present. No obstructing lesion is present. The paranasal sinuses and mastoid air cells are otherwise clear. Bilateral lens replacements are noted. Globes and orbits are otherwise unremarkable.  IMPRESSION: 1. No acute intracranial abnormality. 2. Moderate atrophy and advanced confluent white matter disease likely reflects the  sequela of chronic microvascular ischemia. 3. 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 4. Bilateral mastoid effusions. No obstructing lesion is present.  Electronically Signed By: San Morelle M.D. On: 05/09/2020 15:04   Impression   Theodore Jones is a 87 y.o. male with PMH significant for seizure disorder, HTN, CAD s/p right ICA stent in 2018. HLD, remote MI, Hodgkin lymphoma s/p chemotherapy, and GERD. His neurologic examination is notable for patient that is extremely hard of hearing with ongoing confusion with some improvement since admission per family without return to baseline. Bilateral lower extremity weakness 2/2 fall with lumbar spine fractures at L1 - L2 s/p khyphoplasty 5/10.   Patient with unprovoked seizure and subacute cognitive decline 2 weeks prior to hospitalization. Routine EEG with mild diffuse encephalopathy, nonspecific etiology but without seizures or epileptiform discharges. Keppra increased from 500 mg BID to 750 mg BID due to recent unprovoked seizure.   Recommendations  - Continue Keppra 750 mg BID - Follow up with outpatient neurology for further AED management - Outpatient seizure precautions: [facility] statutes, patients with seizures are not allowed to drive until they have been seizure-free for six months. Use caution when using heavy equipment or power tools. Avoid working on ladders or at heights. Take showers instead of baths. Ensure the water temperature is not too high on the home water heater. Do not go swimming alone. Do not lock yourself in a room alone (i.e. bathroom). When caring for infants or small children, sit down when holding, feeding, or changing them to minimize risk of injury to the child in the event you have a seizure. Maintain good sleep hygiene. Avoid alcohol.   ______________________________________________________________________   Thank you for the opportunity to take part in the  care of this patient. If you have any further questions, please contact the neurology consultation attending.  Signed,  [place] Pager Number [phone]

## 2020-05-13 NOTE — Care Management (Signed)
Spoke w patient's son [name] to notify him that patient was not accepted at [facility]. Patient has been accepted at multiple other facilities. Requested [name] to choose another facility, he stated that [facility] was 2-3 minutes from his house. He states that he is going to go over there and talk to them. He would not, at this time, provide second choice for placement.  Patient is nearing medical stability for discharge.

## 2020-05-13 NOTE — Progress Notes (Signed)
Patient's son reports patient's phone is missing from the bedside. Last seen approx. 430-5 o'clock in the evening on 05/12/20. This nurse did not recall seeing a cell phone with the patient this AM. I called down to security to check if the phone had been returned, they did not have record of it. I called service response to ask if it was possibly left on the tray and with their department, the representative stated the supervisor currently on duty does not have any belongings turned in and will follow up. Will try to contact laundry to check with them also.

## 2020-05-14 ENCOUNTER — Inpatient Hospital Stay (HOSPITAL_COMMUNITY): Payer: Medicare Other

## 2020-05-14 DIAGNOSIS — Z87898 Personal history of other specified conditions: Secondary | ICD-10-CM

## 2020-05-14 LAB — URINALYSIS, ROUTINE W REFLEX MICROSCOPIC
Bacteria, UA: NONE SEEN
Bilirubin Urine: NEGATIVE
Glucose, UA: NEGATIVE mg/dL
Ketones, ur: 20 mg/dL — AB
Nitrite: NEGATIVE
Protein, ur: >=300 mg/dL — AB
Specific Gravity, Urine: 1.024 (ref 1.005–1.030)
WBC, UA: >50 WBC/hpf — ABNORMAL HIGH (ref 0–5)
pH: 5 (ref 5.0–8.0)

## 2020-05-14 LAB — LACTIC ACID, PLASMA
Lactic Acid, Venous: 2.2 mmol/L (ref 0.5–1.9)
Lactic Acid, Venous: 1.4 mmol/L (ref 0.5–1.9)

## 2020-05-14 LAB — SARS CORONAVIRUS 2 (TAT 6-24 HRS): SARS Coronavirus 2: NEGATIVE

## 2020-05-14 LAB — PROCALCITONIN: Procalcitonin: <0.1 ng/mL

## 2020-05-14 MED ORDER — ACETAMINOPHEN 650 MG RE SUPP
650.0000 mg | RECTAL | Status: DC | PRN
  Administered 2020-05-14: 650 mg via RECTAL
  Filled 2020-05-14: qty 1

## 2020-05-14 MED ORDER — SODIUM CHLORIDE 0.9 % IV BOLUS
1000.0000 mL | Freq: Once | INTRAVENOUS | Status: AC
  Administered 2020-05-14: 1000 mL via INTRAVENOUS

## 2020-05-14 MED ORDER — ACETAMINOPHEN 10 MG/ML IV SOLN
1000.0000 mg | Freq: Four times a day (QID) | INTRAVENOUS | Status: AC
  Administered 2020-05-14 – 2020-05-15 (×4): 1000 mg via INTRAVENOUS
  Filled 2020-05-14 (×4): qty 100

## 2020-05-14 NOTE — Progress Notes (Signed)
Occupational Therapy Treatment Patient Details Name: Theodore Jones MRN: [id number] DOB: 08/16/1931 Today's Date: 05/14/2020    History of present illness Pt is a 87 y.o. M who presents with worsening confusion, LBP, multiple falls with generalized weakness and cognitive decline. UA negative for pyuria. CT negative for acute findings. EEG showed no evidence of seizures. MRI lumbar spine showing acute compression fracture of L1 and 2. S/p L1 kyphoplasty 5/10. Significant PMH: OSA, seizure disorder, prior MI, CAD, HTN.   OT comments  Pt demonstrating SOB, Increased rate of respiration, and altered response rate on entering. OT/PTA to do a cotreat with pt due to reported increased weakness by MD this morning. PTA/OT notified RN to come check pt out. Verbal responses and cognition were assessed and very limited. Pt had slurred speech and was unable to provide accurate responses. RRT was called and MD notified. OT and PTA provided total assist for pt rolling to the left for rectal thermometer and repositioning pt in bed, in hopes of assisting pt's breathing. Throughout session pt was unable to follow commands or respond accurately. Pt continues to benefit from SNF placement once medically stable. Acute OT to continue to follow.    Follow Up Recommendations  SNF    Equipment Recommendations       Recommendations for Other Services      Precautions / Restrictions Precautions Precautions: Fall;Back Precaution Comments: Back precautions for comfort Restrictions Weight Bearing Restrictions: No       Mobility Bed Mobility Overal bed mobility: Needs Assistance Bed Mobility: Rolling Rolling: Total assist;+2 for physical assistance         General bed mobility comments: Performed rolling to his L side for rectal temp, Post rolling positioning in bed in chair like position to improve respiratory function.  SPO2 increased to 98% on 2L [place].    Transfers Overall transfer level:  (deferred  due to today's presentation.)               General transfer comment: Deferred due to today's presentation.    Balance                                           ADL either performed or assessed with clinical judgement   ADL                                               Vision       Perception     Praxis      Cognition Arousal/Alertness: Lethargic Behavior During Therapy: WFL for tasks assessed/performed Overall Cognitive Status: History of cognitive impairments - at baseline Area of Impairment: Following commands;Problem solving                       Following Commands: Follows one step commands inconsistently     Problem Solving: Slow processing;Decreased initiation;Difficulty sequencing;Requires verbal cues;Requires tactile cues General Comments: Pt with slurred speech and increase in tremoring.  SPO2 85% on RA called RN to room, RR 28, rapid response called , rectal temp 103 with concern for infection.  MD ordered labs to rule this out.        Exercises     Shoulder Instructions       General  Comments Pt was breathing very heavy and fast upon entry, O2 85% on RA, RN said to put pt on 2L O2, Rectal temp 103.1, bp 142/78, HR 108 bpm. PTA/OT called RN into room and RN called RRT.    Pertinent Vitals/ Pain       Pain Assessment: Faces Faces Pain Scale: No hurt (does not appear in pain, but unable to answer when asked.)  Home Living                                          Prior Functioning/Environment              Frequency  Min 2X/week        Progress Toward Goals  OT Goals(current goals can now be found in the care plan section)  Progress towards OT goals: Not progressing toward goals - comment  Acute Rehab OT Goals Patient Stated Goal: none stated OT Goal Formulation: Patient unable to participate in goal setting Time For Goal Achievement: 05/25/20 Potential to Achieve  Goals: Fair ADL Goals Pt Will Perform Grooming: with set-up;with supervision;standing Pt Will Perform Upper Body Bathing: with set-up;sitting Pt Will Perform Upper Body Dressing: with set-up;with supervision;sitting Pt Will Transfer to Toilet: with supervision;stand pivot transfer;bedside commode  Plan Discharge plan remains appropriate;Frequency remains appropriate    Co-evaluation    PT/OT/SLP Co-Evaluation/Treatment: Yes Reason for Co-Treatment: Complexity of the patient's impairments (multi-system involvement)          AM-PAC OT "6 Clicks" Daily Activity     Outcome Measure   Help from another person eating meals?: Total Help from another person taking care of personal grooming?: Total Help from another person toileting, which includes using toliet, bedpan, or urinal?: Total Help from another person bathing (including washing, rinsing, drying)?: Total Help from another person to put on and taking off regular upper body clothing?: Total Help from another person to put on and taking off regular lower body clothing?: Total 6 Click Score: 6    End of Session    OT Visit Diagnosis: Unsteadiness on feet (R26.81);Muscle weakness (generalized) (M62.81);Repeated falls (R29.6)   Activity Tolerance Treatment limited secondary to medical complications (Comment)   Patient Left in bed;with nursing/sitter in room   Nurse Communication Other (comment) (Pt breathing hard, cognition altered, shaking all over)        Time: 1447-1510 OT Time Calculation (min): 23 min  Charges: OT General Charges $OT Visit: 1 Visit OT Treatments $Self Care/Home Management : 23-37 mins  Abbie Berling H., OTR/L Acute Rehabilitation  Carolyn Maniscalco Elane Madyx Delfin 05/14/2020, 4:15 PM

## 2020-05-14 NOTE — TOC Progression Note (Addendum)
Transition of Care [facility]) - Progression Note    Patient Details  Name: Theodore Jones MRN: [id number] Date of Birth: 1931-09-17  Transition of [facility] CM/SW [place], [place] Phone Number: 05/14/2020, 12:04 PM  Clinical Narrative:     CSW called pt son regarding SNF choice; no answer, left voicemail requesting return call.   Pt son Theodore Jones called CSW back promptly. He stated that he spoke with [facility] and they are going to confirm with him whether or not they can accept pt sometime today. Second choice is [place] [facility]. CSW called and left message with [facility] requesting return call.   1421: [facility] can accept pt tomorrow. Pt to bring his own bipap to SNF. Covid test requested.    Expected Discharge Plan: [place]    Expected Discharge Plan and Services Expected Discharge Plan: [place]   Discharge Planning Services: CM Consult   Living arrangements for the past 2 months: Single Family Home Expected Discharge Date: 05/14/20               DME Arranged: N/A DME Agency: NA       HH Arranged: NA           Social Determinants of Health (SDOH) Interventions    Readmission Risk Interventions No flowsheet data found.

## 2020-05-14 NOTE — Discharge Summary (Signed)
Physician Discharge Summary  Theodore Jones [id number] DOB: 1931/04/01 DOA: 05/08/2020  PCP: Pcp, No  Admit date: 05/08/2020 Discharge date: 05/14/2020  Admitted From: Home Disposition: Skilled nursing facility  Recommendations for Outpatient Follow-up:  1. Follow up with PCP in 1-2 weeks 2. Please obtain BMP/CBC/magnesium/phosphorus in one week   Home Health: Not applicable Equipment/Devices: Not applicable  Discharge Condition: Fair CODE STATUS: DNR Diet recommendation: Low-salt diet  Discharge summary: 87 year old gentleman with history of obstructive sleep apnea on BiPAP, seizure disorder on Keppra, previous MI, chronic constipation, essential hypertension, hyperlipidemia and GERD brought to the emergency room due to worsening confusion, low back pain and multiple falls at home along with generalized weakness and cognitive decline for about 2 weeks.  On presentation he was hemodynamically stable.  UA was negative for pyuria.  He was found to have acute compression fracture of L1 and L2. Followed by neurology for altered mental status.  IR was consulted and underwent kyphoplasty on 5/10.   Assessment & plan of care:    # Acute metabolic encephalopathy, unclear cause.  Probably multifactorial. Presented with worsening confusion and generalized weakness.  This is a recurring event.  Currently stabilized. EEG showed no evidence of seizures. Initial CT scan and subsequent MRI of the brain was negative for any acute findings. Urinalysis including cultures were negative for presence of any infection. [id number], TSH and folic acid and ammonia levels were normal. Neurology recommended conservative management.  All-time fall precautions and delirium precautions. Avoiding narcotics.  # Acute compression fracture L1 and L2: Underwent L1 balloon kyphoplasty on 5/10 by interventional etiology.  Uneventful.  Currently on Tylenol for pain relief.  # Seizure disorder: Patient was on Keppra  500 mg twice a day before coming to the hospital.  With altered mental status, neurology recommended increased dose of Keppra to 750 mg twice a day.  # Essential hypertension: Currently stable on isosorbide mononitrate.  # Hyperlipidemia: On Zetia and rosuvastatin.  Continue.  # OSA on BiPAP: Using nightly.  # Anxiety: Stable on nighttime Klonopin and gabapentin.  # Ambulatory dysfunction and deconditioning: Will benefit with skilled nursing facility rehab.    Patient is medically stable to transfer to skilled level of care.   Discharge Diagnoses:  Active Problems:   AMS (altered mental status)    Discharge Instructions  Discharge Instructions    Diet - low sodium heart healthy   Complete by: As directed    Increase activity slowly   Complete by: As directed      Allergies as of 05/14/2020   No Known Allergies     Medication List    TAKE these medications   aspirin 81 MG tablet Take 81 mg by mouth daily.   clonazePAM 1 MG tablet Commonly known as: KLONOPIN Take 1 tablet (1 mg total) by mouth at bedtime for 5 days.   ezetimibe 10 MG tablet Commonly known as: ZETIA Take 10 mg by mouth daily.   Fish Oil 1000 MG Caps Take 1 capsule by mouth daily.   gabapentin 300 MG capsule Commonly known as: NEURONTIN Take 600 mg by mouth 3 (three) times daily.   isosorbide mononitrate 60 MG 24 hr tablet Commonly known as: IMDUR Take 60 mg by mouth daily.   latanoprost 0.005 % ophthalmic solution Commonly known as: XALATAN Place 1 drop into both eyes at bedtime.   levETIRAcetam 750 MG tablet Commonly known as: KEPPRA Take 1 tablet (750 mg total) by mouth 2 (two) times daily.   loratadine  10 MG tablet Commonly known as: CLARITIN Take 10 mg by mouth daily.   montelukast 10 MG tablet Commonly known as: SINGULAIR Take 1 tablet by mouth daily.   Multivitamin Adults Tabs Take 1 tablet by mouth daily.   omeprazole 40 MG capsule Commonly known as:  PRILOSEC Take 1 capsule by mouth 2 (two) times daily.   rosuvastatin 5 MG tablet Commonly known as: CRESTOR Take 5 mg by mouth daily.   solifenacin 5 MG tablet Commonly known as: VESICARE Take 5 mg by mouth every evening.   tamsulosin 0.4 MG Caps capsule Commonly known as: FLOMAX Take 0.4 mg by mouth in the morning and at bedtime.       No Known Allergies  Consultations:  Neurology  Interventional radiology   Procedures/Studies: DG Lumbar Spine Complete  Result Date: 05/08/2020 CLINICAL DATA:  Back pain.  Multiple falls over the last 3 days. EXAM: LUMBAR SPINE - COMPLETE 4+ VIEW COMPARISON:  Report from 06/16/2015 FINDINGS: Aortoiliac atherosclerotic vascular disease. Reduced intervertebral disc height at L5-S1 indicative of degenerative disc disease. Degenerative facet arthropathy at L4-5 and L5-S1 bilaterally. No fracture or acute subluxation is identified. IMPRESSION: 1. No acute bony findings. 2. Degenerative disc disease at L5-S1. 3. Lower lumbar spondylosis particularly of the facet joints at L4-5 and L5-S1. 4.  Aortic Atherosclerosis (ICD10-I70.0). Electronically Signed   By: Van Clines M.D.   On: 05/08/2020 14:49   CT Head Wo Contrast  Result Date: 05/08/2020 CLINICAL DATA:  Dizziness. EXAM: CT HEAD WITHOUT CONTRAST TECHNIQUE: Contiguous axial images were obtained from the base of the skull through the vertex without intravenous contrast. COMPARISON:  February 06, 2015 FINDINGS: Brain: There is mild cerebral atrophy with widening of the extra-axial spaces and ventricular dilatation. There are areas of decreased attenuation within the white matter tracts of the supratentorial brain, consistent with microvascular disease changes. A small, chronic left para thalamic and right basal ganglia lacunar infarcts are noted. Vascular: No hyperdense vessel or unexpected calcification. Skull: Normal. Negative for fracture or focal lesion. Sinuses/Orbits: No acute finding. Other:  None. IMPRESSION: 1. Generalized cerebral atrophy. 2. No acute intracranial abnormality. Electronically Signed   By: Virgina Norfolk M.D.   On: 05/08/2020 18:29   MR BRAIN WO CONTRAST  Result Date: 05/09/2020 CLINICAL DATA:  Neuro deficit, acute, stroke suspected. New onset of progressive confusion and weakness. Seizures. EXAM: MRI HEAD WITHOUT CONTRAST TECHNIQUE: Multiplanar, multiecho pulse sequences of the brain and surrounding structures were obtained without intravenous contrast. COMPARISON:  CT head without contrast 05/08/2020 FINDINGS: Brain: Moderate atrophy and advanced confluent white matter disease is present bilaterally. No acute infarct, hemorrhage, or mass lesion is present. Remote lacunar infarcts are present in the thalami bilaterally, more prominent on the right. A remote right paramedian lacunar infarct is present in the central pons. White matter changes are present bilaterally in the central pons. Remote lacunar infarcts are present in the centrum semi ovale, right greater than left. Cerebellum is unremarkable. The internal auditory canals are within normal limits. The ventricles are proportionate to the degree of atrophy. No significant extraaxial fluid collection is present. Vascular: Flow is present in the major intracranial arteries. Skull and upper cervical spine: The craniocervical junction is normal. Upper cervical spine is within normal limits. Marrow signal is unremarkable. Sinuses/Orbits: Mucosal thickening is present along the inferior maxillary sinuses bilaterally. Bilateral mastoid effusions are present. No obstructing lesion is present. The paranasal sinuses and mastoid air cells are otherwise clear. Bilateral lens replacements are noted. Globes  and orbits are otherwise unremarkable. IMPRESSION: 1. No acute intracranial abnormality. 2. Moderate atrophy and advanced confluent white matter disease likely reflects the sequela of chronic microvascular ischemia. 3. Remote lacunar  infarcts of the thalami and centrum semi ovale bilaterally. 4. Bilateral mastoid effusions. No obstructing lesion is present. Electronically Signed   By: San Morelle M.D.   On: 05/09/2020 15:04   MR THORACIC SPINE WO CONTRAST  Result Date: 05/08/2020 CLINICAL DATA:  Back injury 2 days ago. Frequent falls over the last 2 weeks. Mid to low back pain. EXAM: MRI THORACIC AND LUMBAR SPINE WITHOUT CONTRAST TECHNIQUE: Multiplanar and multiecho pulse sequences of the thoracic and lumbar spine were obtained without intravenous contrast. COMPARISON:  Lumbar spine radiographs 05/08/2020. Cervical spine radiographs 01/29/2009 FINDINGS: MRI THORACIC SPINE FINDINGS Alignment:  Normal. Vertebrae: There is a chronic appearing T3 compression deformity with approximately 50% loss of vertebral body height and minimal retropulsion of the inferior endplate. There is no residual bone marrow edema. The posterior elements are intact. No acute fractures are seen within the thoracic spine. Cord: Normal in signal and caliber. Conus medullaris extends to the L2 level. Paraspinal and other soft tissues: No significant paraspinal findings. Small right greater than left dependent pleural effusions. Disc levels: As above, mild osseous retropulsion at T3-4 related to the chronic T3 compression deformity. No cord deformity or foraminal compromise. Mild disc bulging and endplate osteophyte formation within the lower thoracic spine without significant disc herniation, spinal stenosis or nerve root encroachment. MRI LUMBAR SPINE FINDINGS Segmentation:  There are 5 lumbar type vertebral bodies. Alignment:  Physiologic. Vertebrae: There is superior endplate irregularity at L1 with associated linear low signal and surrounding marrow edema in the L1 vertebral body, suspicious for an acute compression deformity. There is also mild marrow edema within the anterior superior corner of the L2 vertebral body which could reflect an additional fracture  or degenerative edema. The posterior elements are intact. There is no osseous retropulsion. Conus medullaris and cauda equina: Conus extends to the L2 level. Conus and cauda equina appear normal. Paraspinal and other soft tissues: Mild paraspinous edema anteriorly at L1, supporting an acute fracture. Disc levels: L1-2: Loss of disc height with mild disc bulging and facet hypertrophy. No spinal stenosis or nerve root encroachment. L2-3: No significant findings. L3-4: Mild loss of disc height with annular disc bulging and facet hypertrophy. No significant spinal stenosis or nerve root encroachment. L4-5: Mild loss of disc height with annular disc bulging, facet and ligamentous hypertrophy. Mild lateral recess and left foraminal narrowing without definite nerve root encroachment. L5-S1: Broad-based disc protrusion with covering spur in the right subarticular zone and medial foramen. This causes asymmetric narrowing of the right lateral recess and possible right S1 nerve root encroachment. In addition, there is mild right foraminal narrowing. Mild bilateral facet hypertrophy. IMPRESSION: 1. Probable acute mild superior endplate compression fracture at L1 with associated marrow edema. 2. Possible minimal superior endplate compression fracture at L2. 3. No acute findings in the thoracic spine. Old healed T3 compression deformity. 4. Lumbar spondylosis as described with disc bulging, endplate osteophytes and facet hypertrophy. At L4-5, the lateral recesses and left foramen are mildly narrowed. At L5-S1, there is asymmetric right lateral recess and right foraminal narrowing. Electronically Signed   By: Richardean Sale M.D.   On: 05/08/2020 20:09   MR LUMBAR SPINE WO CONTRAST  Result Date: 05/08/2020 CLINICAL DATA:  Back injury 2 days ago. Frequent falls over the last 2 weeks. Mid to low  back pain. EXAM: MRI THORACIC AND LUMBAR SPINE WITHOUT CONTRAST TECHNIQUE: Multiplanar and multiecho pulse sequences of the thoracic and  lumbar spine were obtained without intravenous contrast. COMPARISON:  Lumbar spine radiographs 05/08/2020. Cervical spine radiographs 01/29/2009 FINDINGS: MRI THORACIC SPINE FINDINGS Alignment:  Normal. Vertebrae: There is a chronic appearing T3 compression deformity with approximately 50% loss of vertebral body height and minimal retropulsion of the inferior endplate. There is no residual bone marrow edema. The posterior elements are intact. No acute fractures are seen within the thoracic spine. Cord: Normal in signal and caliber. Conus medullaris extends to the L2 level. Paraspinal and other soft tissues: No significant paraspinal findings. Small right greater than left dependent pleural effusions. Disc levels: As above, mild osseous retropulsion at T3-4 related to the chronic T3 compression deformity. No cord deformity or foraminal compromise. Mild disc bulging and endplate osteophyte formation within the lower thoracic spine without significant disc herniation, spinal stenosis or nerve root encroachment. MRI LUMBAR SPINE FINDINGS Segmentation:  There are 5 lumbar type vertebral bodies. Alignment:  Physiologic. Vertebrae: There is superior endplate irregularity at L1 with associated linear low signal and surrounding marrow edema in the L1 vertebral body, suspicious for an acute compression deformity. There is also mild marrow edema within the anterior superior corner of the L2 vertebral body which could reflect an additional fracture or degenerative edema. The posterior elements are intact. There is no osseous retropulsion. Conus medullaris and cauda equina: Conus extends to the L2 level. Conus and cauda equina appear normal. Paraspinal and other soft tissues: Mild paraspinous edema anteriorly at L1, supporting an acute fracture. Disc levels: L1-2: Loss of disc height with mild disc bulging and facet hypertrophy. No spinal stenosis or nerve root encroachment. L2-3: No significant findings. L3-4: Mild loss of disc  height with annular disc bulging and facet hypertrophy. No significant spinal stenosis or nerve root encroachment. L4-5: Mild loss of disc height with annular disc bulging, facet and ligamentous hypertrophy. Mild lateral recess and left foraminal narrowing without definite nerve root encroachment. L5-S1: Broad-based disc protrusion with covering spur in the right subarticular zone and medial foramen. This causes asymmetric narrowing of the right lateral recess and possible right S1 nerve root encroachment. In addition, there is mild right foraminal narrowing. Mild bilateral facet hypertrophy. IMPRESSION: 1. Probable acute mild superior endplate compression fracture at L1 with associated marrow edema. 2. Possible minimal superior endplate compression fracture at L2. 3. No acute findings in the thoracic spine. Old healed T3 compression deformity. 4. Lumbar spondylosis as described with disc bulging, endplate osteophytes and facet hypertrophy. At L4-5, the lateral recesses and left foramen are mildly narrowed. At L5-S1, there is asymmetric right lateral recess and right foraminal narrowing. Electronically Signed   By: Richardean Sale M.D.   On: 05/08/2020 20:09   EEG adult  Result Date: 05/09/2020 Lora Havens, MD     05/09/2020 10:58 AM Patient Name: Theodore Jones MRN: [id number] Epilepsy Attending: Lora Havens Referring Physician/Provider: Dr Irene Pap Date: 05/09/2020 Duration: 28.29 mins Patient history: 87yo M with ams. EEG to evaluate for seizure Level of alertness: Awake AEDs during EEG study: LEV Technical aspects: This EEG study was done with scalp electrodes positioned according to the 10-20 International system of electrode placement. Electrical activity was acquired at a sampling rate of 500Hz  and reviewed with a high frequency filter of 70Hz  and a low frequency filter of 1Hz . EEG data were recorded continuously and digitally stored. Description: The posterior dominant rhythm consists of  8 Hz  activity of moderate voltage (25-35 uV) seen predominantly in posterior head regions, symmetric and reactive to eye opening and eye closing. EEG showed intermittent generalized 3 to 6 Hz theta-delta slowing. Hyperventilation and photic stimulation were not performed.   ABNORMALITY - Intermittent slow, generalized IMPRESSION: This study is suggestive of mild diffuse encephalopathy, nonspecific etiology. No seizures or epileptiform discharges were seen throughout the recording. Priyanka O Yadav   IR KYPHO LUMBAR INC FX REDUCE BONE BX UNI/BIL CANNULATION INC/IMAGING  Result Date: 05/12/2020 INDICATION: 87 year old male with past medical history significant for obstructive sleep apnea, seizure, CAD, hypertension, hyperlipidemia admitted recently for altered mental status and severe low back pain in the setting of recent fall. MRI of the lumbar spine showed an acute compression fracture of the L1 vertebral body. He comes to our service today for a fluoroscopy guided kyphoplasty. EXAM: FLUOROSCOPY GUIDED L1 CORE BONE BIOPSY AND BILOMA KYPHOPLASTY COMPARISON:  MRI of the lumbar spine May 08, 2020. MEDICATIONS: As antibiotic prophylaxis, Ancef 2 gm IV. ANESTHESIA/SEDATION: Moderate (conscious) sedation was employed during this procedure. A total of Versed 1 mg and Fentanyl 50 mcg was administered intravenously. Moderate Sedation Time: 56 minutes. The patient's level of consciousness and vital signs were monitored continuously by radiology nursing throughout the procedure under my direct supervision. FLUOROSCOPY TIME:  Fluoroscopy Time: 21 minutes 48 seconds (0000000 mGy) COMPLICATIONS: None immediate. PROCEDURE: Following a full explanation of the procedure along with the potential associated complications, an informed witnessed consent was obtained from patient's son. The patient was placed in prone position on the angiography table. The lumbar spine region was prepped and draped in a sterile fashion. Under fluoroscopy, the  L1 vertebral body was delineated and the skin area was marked. The skin was infiltrated with a 1% Lidocaine approximately 3.2 cm lateral to the spinous process projection on the right. Using a 22-gauge spinal needle, the soft issue and the peripedicular space and periosteum were infiltrated with Bupivacaine 0.5%. A skin incision was made at the access site. Subsequently, an 11-gauge Kyphon trocar was inserted under fluoroscopic guidance until contact with the pedicle was obtained. The trocar was inserted under light hammer tapping into the pedicle until the posterior boundaries of the vertebral body was reached. The diamond mandrill was removed and one core biopsy wasobtained. The skin was infiltrated with a 1% Lidocaine approximately 3.2 cm lateral to the spinous process projection on the left. Using a 22-gauge spinal needle, the soft issue and the peripedicular space and periosteum were infiltrated with Bupivacaine 0.5%. A skin incision was made at the access site. Subsequently, an 11-gauge Kyphon trocar was inserted under fluoroscopic guidance until contact with the pedicle was obtained. The trocar was inserted under light hammer tapping into the pedicle until the posterior boundaries of the vertebral body was reached. The diamond mandrill was removed. A bone drill was coaxially advanced within the anterior third of the vertebral body and then exchanged for inflatable Kyphon balloons. These were centered within the mid-aspect of the vertebral body. The balloons were inflated to create a void to serve as a repository for the bone cement. Both balloons were deflated and through both cannulas, under continuous fluoroscopy guidance in the AP and lateral views, the vertebral body was filled with previously mixed polymethyl-methacrylate (PMMA) added to barium for opacification. Both cannulas were later removed. Minimal cement extravasation into the T12-L1 disc space seen. No epidural venous contamination was seen. The  access sites were cleaned and covered with a sterile bandage. IMPRESSION:  Successful fluoroscopy-guided bilateral transpedicular approach for L1 vertebral body core bone biopsy and kyphoplasty for treatment of osteoporotic fragility fracture. Bone samples obtained were sent for pathology analysis. Electronically Signed   By: Pedro Earls M.D.   On: 05/12/2020 11:42    (Echo, Carotid, EGD, Colonoscopy, ERCP)    Subjective: Patient seen and examined.  Poor historian.  No overnight events.  Denies any complaints.  No family at the bedside today.   Discharge Exam: Vitals:   05/13/20 2031 05/14/20 0555  BP: (!) 143/84 135/82  Pulse: 88 80  Resp: 16 17  Temp: 98.3 F (36.8 C) 98.2 F (36.8 C)  SpO2: 97% 97%   Vitals:   05/13/20 0616 05/13/20 1317 05/13/20 2031 05/14/20 0555  BP: (!) 152/57 (!) 164/66 (!) 143/84 135/82  Pulse: 64 (!) 43 88 80  Resp: 19 18 16 17   Temp: 98.7 F (37.1 C) 98.4 F (36.9 C) 98.3 F (36.8 C) 98.2 F (36.8 C)  TempSrc: Oral Oral Oral Oral  SpO2: 95% 97% 97% 97%    General: Pt is alert, awake, not in acute distress Appears calm and comfortable.  Chronically sick looking but not in any distress. Cardiovascular: RRR, S1/S2 +, no rubs, no gallops Respiratory: CTA bilaterally, no wheezing, no rhonchi Abdominal: Soft, NT, ND, bowel sounds + Extremities: no edema, no cyanosis    The results of significant diagnostics from this hospitalization (including imaging, microbiology, ancillary and laboratory) are listed below for reference.     Microbiology: Recent Results (from the past 240 hour(s))  Resp Panel by RT-PCR (Flu A&B, Covid) Nasopharyngeal Swab     Status: None   Collection Time: 05/08/20 10:46 PM   Specimen: Nasopharyngeal Swab; Nasopharyngeal(NP) swabs in vial transport medium  Result Value Ref Range Status   SARS Coronavirus 2 by RT PCR NEGATIVE NEGATIVE Final    Comment: (NOTE) SARS-CoV-2 target nucleic acids are NOT  DETECTED.  The SARS-CoV-2 RNA is generally detectable in upper respiratory specimens during the acute phase of infection. The lowest concentration of SARS-CoV-2 viral copies this assay can detect is 138 copies/mL. A negative result does not preclude SARS-Cov-2 infection and should not be used as the sole basis for treatment or other patient management decisions. A negative result may occur with  improper specimen collection/handling, submission of specimen other than nasopharyngeal swab, presence of viral mutation(s) within the areas targeted by this assay, and inadequate number of viral copies(<138 copies/mL). A negative result must be combined with clinical observations, patient history, and epidemiological information. The expected result is Negative.  Fact Sheet for Patients:  EntrepreneurPulse.com.au  Fact Sheet for Healthcare Providers:  IncredibleEmployment.be  This test is no t yet approved or cleared by the [place] FDA and  has been authorized for detection and/or diagnosis of SARS-CoV-2 by FDA under an Emergency Use Authorization (EUA). This EUA will remain  in effect (meaning this test can be used) for the duration of the COVID-19 declaration under Section 564(b)(1) of the Act, 21 U.S.C.section 360bbb-3(b)(1), unless the authorization is terminated  or revoked sooner.       Influenza A by PCR NEGATIVE NEGATIVE Final   Influenza B by PCR NEGATIVE NEGATIVE Final    Comment: (NOTE) The Xpert Xpress SARS-CoV-2/FLU/RSV plus assay is intended as an aid in the diagnosis of influenza from Nasopharyngeal swab specimens and should not be used as a sole basis for treatment. Nasal washings and aspirates are unacceptable for Xpert Xpress SARS-CoV-2/FLU/RSV testing.  Fact Sheet for  Patients: EntrepreneurPulse.com.au  Fact Sheet for Healthcare Providers: IncredibleEmployment.be  This test is not yet  approved or cleared by the [place] FDA and has been authorized for detection and/or diagnosis of SARS-CoV-2 by FDA under an Emergency Use Authorization (EUA). This EUA will remain in effect (meaning this test can be used) for the duration of the COVID-19 declaration under Section 564(b)(1) of the Act, 21 U.S.C. section 360bbb-3(b)(1), unless the authorization is terminated or revoked.  Performed at [facility], [place] [address], [place], [place] [address]      Labs: BNP (last 3 results) No results for input(s): BNP in the last 8760 hours. Basic Metabolic Panel: Recent Labs  Lab 05/08/20 1129 05/09/20 0315 05/10/20 0013 05/13/20 0413  NA 135 138 134* 134*  K 4.3 3.8 3.9 3.9  CL 104 106 100 100  CO2 23 26 24 24   GLUCOSE 115* 112* 105* 129*  BUN 15 13 14 16   CREATININE 0.97 0.87 0.92 0.99  CALCIUM 9.3 8.8* 8.9 8.3*  MG  --  2.1 1.9 2.0  PHOS  --  3.6  --   --    Liver Function Tests: Recent Labs  Lab 05/08/20 1129 05/10/20 0013  AST 22 22  ALT 13 12  ALKPHOS 42 45  BILITOT 0.8 1.3*  PROT 7.1 6.7  ALBUMIN 3.6 3.5   No results for input(s): LIPASE, AMYLASE in the last 168 hours. Recent Labs  Lab 05/10/20 0013  AMMONIA 27   CBC: Recent Labs  Lab 05/08/20 1129 05/09/20 0315 05/10/20 0013 05/13/20 0413  WBC 8.7 6.9 9.5 8.7  NEUTROABS  --   --  6.3 6.0  HGB 14.8 14.1 15.1 13.7  HCT 45.4 43.7 45.2 40.8  MCV 89.4 90.5 87.4 86.6  PLT 159 170 167 192   Cardiac Enzymes: No results for input(s): CKTOTAL, CKMB, CKMBINDEX, TROPONINI in the last 168 hours. BNP: Invalid input(s): POCBNP CBG: No results for input(s): GLUCAP in the last 168 hours. D-Dimer No results for input(s): DDIMER in the last 72 hours. Hgb A1c No results for input(s): HGBA1C in the last 72 hours. Lipid Profile No results for input(s): CHOL, HDL, LDLCALC, TRIG, CHOLHDL, LDLDIRECT in the last 72 hours. Thyroid function studies No results for input(s): TSH, T4TOTAL, T3FREE,  THYROIDAB in the last 72 hours.  Invalid input(s): FREET3 Anemia work up No results for input(s): VITAMINB12, FOLATE, FERRITIN, TIBC, IRON, RETICCTPCT in the last 72 hours. Urinalysis    Component Value Date/Time   COLORURINE YELLOW 05/08/2020 1110   APPEARANCEUR CLEAR 05/08/2020 1110   LABSPEC 1.017 05/08/2020 1110   PHURINE 6.0 05/08/2020 1110   GLUCOSEU NEGATIVE 05/08/2020 1110   HGBUR NEGATIVE 05/08/2020 [place] 05/08/2020 [place] 05/08/2020 1110   PROTEINUR 100 (A) 05/08/2020 1110   NITRITE NEGATIVE 05/08/2020 [place] 05/08/2020 1110   Sepsis Labs Invalid input(s): PROCALCITONIN,  WBC,  LACTICIDVEN Microbiology Recent Results (from the past 240 hour(s))  Resp Panel by RT-PCR (Flu A&B, Covid) Nasopharyngeal Swab     Status: None   Collection Time: 05/08/20 10:46 PM   Specimen: Nasopharyngeal Swab; Nasopharyngeal(NP) swabs in vial transport medium  Result Value Ref Range Status   SARS Coronavirus 2 by RT PCR NEGATIVE NEGATIVE Final    Comment: (NOTE) SARS-CoV-2 target nucleic acids are NOT DETECTED.  The SARS-CoV-2 RNA is generally detectable in upper respiratory specimens during the acute phase of infection. The lowest concentration of SARS-CoV-2 viral copies  this assay can detect is 138 copies/mL. A negative result does not preclude SARS-Cov-2 infection and should not be used as the sole basis for treatment or other patient management decisions. A negative result may occur with  improper specimen collection/handling, submission of specimen other than nasopharyngeal swab, presence of viral mutation(s) within the areas targeted by this assay, and inadequate number of viral copies(<138 copies/mL). A negative result must be combined with clinical observations, patient history, and epidemiological information. The expected result is Negative.  Fact Sheet for Patients:   EntrepreneurPulse.com.au  Fact Sheet for Healthcare Providers:  IncredibleEmployment.be  This test is no t yet approved or cleared by the [place] FDA and  has been authorized for detection and/or diagnosis of SARS-CoV-2 by FDA under an Emergency Use Authorization (EUA). This EUA will remain  in effect (meaning this test can be used) for the duration of the COVID-19 declaration under Section 564(b)(1) of the Act, 21 U.S.C.section 360bbb-3(b)(1), unless the authorization is terminated  or revoked sooner.       Influenza A by PCR NEGATIVE NEGATIVE Final   Influenza B by PCR NEGATIVE NEGATIVE Final    Comment: (NOTE) The Xpert Xpress SARS-CoV-2/FLU/RSV plus assay is intended as an aid in the diagnosis of influenza from Nasopharyngeal swab specimens and should not be used as a sole basis for treatment. Nasal washings and aspirates are unacceptable for Xpert Xpress SARS-CoV-2/FLU/RSV testing.  Fact Sheet for Patients: EntrepreneurPulse.com.au  Fact Sheet for Healthcare Providers: IncredibleEmployment.be  This test is not yet approved or cleared by the [place] FDA and has been authorized for detection and/or diagnosis of SARS-CoV-2 by FDA under an Emergency Use Authorization (EUA). This EUA will remain in effect (meaning this test can be used) for the duration of the COVID-19 declaration under Section 564(b)(1) of the Act, 21 U.S.C. section 360bbb-3(b)(1), unless the authorization is terminated or revoked.  Performed at [facility], [place] [address] [place], [place] [address]      Time coordinating discharge:  32 minutes  SIGNED:   Barb Merino, MD  [facility] 05/14/2020, 11:24 AM

## 2020-05-14 NOTE — Progress Notes (Signed)
Came back to examine patient because patient started having chills and developed temperature 103.  Patient is tachypneic. On examination, blood pressures are stable.  Patient is incoherent due to temperature.  No localizing evidence of any infection.  Plan: CBC with differential, blood cultures, lactic acid, procalcitonin.  Blood cultures x2.  Urinalysis. 1 L normal saline. Chest x-ray.  COVID-19 swab. Hold off on antibiotics for now.  Will consider if lactic acid and procalcitonin is very high. Kyphoplasty site looks clean and dry and nontender.  Less likely infected.  I called and updated patient's son Mr. [name].  He is an Therapist, sports.  We discussed about plan.  We will discontinue discharge planning today.  We also discussed whether patient is ready for comfort care or hospice given his overall decline for many years now. He is interested to talk to palliative care.  Already DNR.  Will consult palliative care medicine.

## 2020-05-14 NOTE — Significant Event (Signed)
Rapid Response Event Note   Reason for Call :  "shaking"   Recommended obtain rectal temp  Initial Focused Assessment:  Patient is alert but difficult to understand, confused. Lung sounds coarse He is hot to the touch, He is shivering  165/80  HR 109  RR 32  O2 sat 99%  Rectal temp 103.1  Dr Barb Merino came to bedside to assess patient Inspected back, no signs of infection.  Interventions:  Tylenol Ice Packs Labs: CBC, lactic acid, procalcitonin, blood cultures PCXR UA  NS bolus  Plan of Care:     Event Summary:   MD Notified: Barb Merino Call Time: [place] Time: 1657 End Time: [place]  Raliegh Ip, RN

## 2020-05-14 NOTE — Progress Notes (Addendum)
Physical Therapy Treatment Patient Details Name: Theodore Jones MRN: [id number] DOB: [birth year] Today's Date: 05/14/2020    History of Present Illness Pt is a 87 y.o. M who presents with worsening confusion, LBP, multiple falls with generalized weakness and cognitive decline. UA negative for pyuria. CT negative for acute findings. EEG showed no evidence of seizures. MRI lumbar spine showing acute compression fracture of L1 and 2. S/p L1 kyphoplasty 5/10. Significant PMH: OSA, seizure disorder, prior MI, CAD, HTN.    PT Comments    Pt supine in bed sleeping on arrival.  Upon waking he continues with increasing WOB and shivering.  SPO2 decreased and 2L O2 applied.  Performed strength assessment of limbs and remains symmetrical but he is not following commands this session and speech is more garbled.  Pt continues to benefit from snf placement when he is medically able to d/c.     Follow Up Recommendations  SNF     Equipment Recommendations  None recommended by PT    Recommendations for Other Services       Precautions / Restrictions Precautions Precautions: Fall;Back Precaution Comments: Back precautions for comfort Restrictions Weight Bearing Restrictions: No    Mobility  Bed Mobility Overal bed mobility: Needs Assistance Bed Mobility: Rolling Rolling: Total assist;+2 for physical assistance         General bed mobility comments: Performed rolling to his L side for rectal temp, Post rolling positioning in bed in chair like position to improve respiratory function.  SPO2 increased to 98% on 2L [place].    Transfers Overall transfer level:  (deferred due to today's presentation.)                  Ambulation/Gait                 Stairs             Wheelchair Mobility    Modified Rankin (Stroke Patients Only)       Balance                                            Cognition Arousal/Alertness: Awake/alert Behavior  During Therapy: WFL for tasks assessed/performed Overall Cognitive Status: History of cognitive impairments - at baseline Area of Impairment: Following commands;Problem solving                       Following Commands: Follows one step commands inconsistently     Problem Solving: Slow processing;Decreased initiation;Difficulty sequencing;Requires verbal cues;Requires tactile cues General Comments: Pt with slurred speech and increase in tremoring.  SPO2 85% on RA called RN to room, RR 28, rapid response called , rectal temp 103 with concern for infection.  MD ordered labs to rule this out.      Exercises      General Comments        Pertinent Vitals/Pain Pain Assessment: Faces Faces Pain Scale: No hurt (does not appear in pain but unable to answer when asked.)    Home Living                      Prior Function            PT Goals (current goals can now be found in the care plan section) Acute Rehab PT Goals Patient Stated Goal: none stated Potential to Achieve Goals:  Fair Progress towards PT goals: Progressing toward goals    Frequency    Min 3X/week      PT Plan Current plan remains appropriate    Co-evaluation PT/OT/SLP Co-Evaluation/Treatment: Yes Reason for Co-Treatment: Complexity of the patient's impairments (multi-system involvement)          AM-PAC PT "6 Clicks" Mobility   Outcome Measure  Help needed turning from your back to your side while in a flat bed without using bedrails?: Total Help needed moving from lying on your back to sitting on the side of a flat bed without using bedrails?: Total Help needed moving to and from a bed to a chair (including a wheelchair)?: Total Help needed standing up from a chair using your arms (e.g., wheelchair or bedside chair)?: Total Help needed to walk in hospital room?: Total Help needed climbing 3-5 steps with a railing? : Total 6 Click Score: 6    End of Session Equipment Utilized During  Treatment: Gait belt Activity Tolerance: Patient limited by pain Patient left: in bed;with call bell/phone within reach;with bed alarm set Nurse Communication: Mobility status PT Visit Diagnosis: Unsteadiness on feet (R26.81);Muscle weakness (generalized) (M62.81);History of falling (Z91.81);Difficulty in walking, not elsewhere classified (R26.2);Pain     Time: 8841-6606 PT Time Calculation (min) (ACUTE ONLY): 23 min  Charges:  $Therapeutic Activity: 8-22 mins                     Erasmo Leventhal , PTA Acute Rehabilitation Services Pager [phone] Office [phone]     Clementina Mareno Eli Hose 05/14/2020, 3:40 PM

## 2020-05-14 NOTE — Progress Notes (Signed)
Patient working with PT when RN asked to come to room. Patient is visibly shaking. Right arm is cold to touch, left arm is warm. Patient unable to follow commands (raise his legs/arms and hold it). Patient at times with incomprehensible speech. I reached out to RRT to come assess patient. VS taken. Rectal temp 103.1. Requested rectal tylenol from DR Ghimire. Asked to give after collection of blood cultures. Will continue to monitor.

## 2020-05-14 NOTE — Progress Notes (Signed)
   05/14/20 1503  Assess: MEWS Score  Temp (!) 103.1 F (39.5 C)  BP (!) 142/76  Pulse Rate (!) 109  Resp (!) 32  Level of Consciousness Alert  Assess: MEWS Score  MEWS Temp 2  MEWS Systolic 0  MEWS Pulse 1  MEWS RR 2  MEWS LOC 0  MEWS Score 5  MEWS Score Color Red  Assess: if the MEWS score is Yellow or Red  Were vital signs taken at a resting state? Yes  Focused Assessment Change from prior assessment (see assessment flowsheet)  Early Detection of Sepsis Score *See Row Information* Low  MEWS guidelines implemented *See Row Information* Yes  Treat  Pain Scale 0-10  Pain Score 0  Take Vital Signs  Increase Vital Sign Frequency  Red: Q 1hr X 4 then Q 4hr X 4, if remains red, continue Q 4hrs  Escalate  MEWS: Escalate Red: discuss with charge nurse/RN and provider, consider discussing with RRT  Notify: Charge Nurse/RN  Name of Charge Nurse/RN Notified Daron Offer, RN  Date Charge Nurse/RN Notified 05/14/20  Time Charge Nurse/RN Notified 1062  Notify: Provider  Provider Name/Title Raelyn Mora., ND  Date Provider Notified 05/14/20  Time Provider Notified 1511  Notification Type Face-to-face  Notification Reason Change in status  Provider response See new orders  Date of Provider Response 05/14/20  Time of Provider Response 1511  Notify: Rapid Response  Name of Rapid Response RN Notified Hella, RN  Date Rapid Response Notified 05/14/20  Time Rapid Response Notified 6948  Document  Patient Outcome Other (Comment) (awaiting outcome of interventions)  Assess: SIRS CRITERIA  SIRS Temperature  1  SIRS Pulse 1  SIRS Respirations  1  SIRS WBC 0  SIRS Score Sum  3  1 liter normal saline bolus administered as ordered. Labwork ordered and results pending. Urinalysis specimen collected via in and out sent to lab. Monitoring to continue

## 2020-05-15 DIAGNOSIS — Z515 Encounter for palliative care: Secondary | ICD-10-CM

## 2020-05-15 DIAGNOSIS — S32010A Wedge compression fracture of first lumbar vertebra, initial encounter for closed fracture: Secondary | ICD-10-CM

## 2020-05-15 DIAGNOSIS — Z66 Do not resuscitate: Secondary | ICD-10-CM

## 2020-05-15 DIAGNOSIS — W19XXXA Unspecified fall, initial encounter: Secondary | ICD-10-CM

## 2020-05-15 DIAGNOSIS — Z87898 Personal history of other specified conditions: Secondary | ICD-10-CM

## 2020-05-15 DIAGNOSIS — Z7189 Other specified counseling: Secondary | ICD-10-CM

## 2020-05-15 LAB — CREATININE, SERUM
Creatinine, Ser: 1.11 mg/dL (ref 0.61–1.24)
GFR, Estimated: >60 mL/min (ref >60)

## 2020-05-15 LAB — CBC WITH DIFFERENTIAL/PLATELET
Abs Immature Granulocytes: 0.06 10*3/uL (ref 0.00–0.07)
Basophils Absolute: 0.1 10*3/uL (ref 0.0–0.1)
Basophils Relative: 0 %
Eosinophils Absolute: 0.1 10*3/uL (ref 0.0–0.5)
Eosinophils Relative: 1 %
HCT: 39.6 % (ref 39.0–52.0)
Hemoglobin: 12.8 g/dL — ABNORMAL LOW (ref 13.0–17.0)
Immature Granulocytes: 0 %
Lymphocytes Relative: 10 %
Lymphs Abs: 1.5 10*3/uL (ref 0.7–4.0)
MCH: 29 pg (ref 26.0–34.0)
MCHC: 32.3 g/dL (ref 30.0–36.0)
MCV: 89.6 fL (ref 80.0–100.0)
Monocytes Absolute: 1.8 10*3/uL — ABNORMAL HIGH (ref 0.1–1.0)
Monocytes Relative: 12 %
Neutro Abs: 11.8 10*3/uL — ABNORMAL HIGH (ref 1.7–7.7)
Neutrophils Relative %: 77 %
Platelets: 176 10*3/uL (ref 150–400)
RBC: 4.42 MIL/uL (ref 4.22–5.81)
RDW: 14.1 % (ref 11.5–15.5)
WBC: 15.3 10*3/uL — ABNORMAL HIGH (ref 4.0–10.5)
nRBC: 0 % (ref 0.0–0.2)

## 2020-05-15 LAB — PROCALCITONIN: Procalcitonin: 0.11 ng/mL

## 2020-05-15 MED ORDER — BIOTENE DRY MOUTH MT LIQD: 15.0000 mL | OROMUCOSAL | Status: DC | PRN

## 2020-05-15 MED ORDER — MORPHINE SULFATE (CONCENTRATE) 10 MG/0.5ML PO SOLN: 5.0000 mg | ORAL | Status: DC | PRN

## 2020-05-15 MED ORDER — MORPHINE SULFATE (PF) 2 MG/ML IV SOLN
1.0000 mg | INTRAVENOUS | Status: DC | PRN
  Administered 2020-05-15 (×2): 2 mg via INTRAVENOUS
  Filled 2020-05-15 (×2): qty 1

## 2020-05-15 MED ORDER — SCOPOLAMINE 1 MG/3DAYS TD PT72
1.0000 | MEDICATED_PATCH | TRANSDERMAL | Status: DC
  Administered 2020-05-15: 1.5 mg via TRANSDERMAL
  Filled 2020-05-15: qty 1

## 2020-05-15 MED ORDER — ONDANSETRON HCL 4 MG/2ML IJ SOLN: 4.0000 mg | Freq: Four times a day (QID) | INTRAMUSCULAR | Status: DC | PRN

## 2020-05-15 MED ORDER — SODIUM CHLORIDE 0.9 % IV SOLN
1.0000 g | INTRAVENOUS | Status: DC
  Filled 2020-05-15: qty 10

## 2020-05-15 MED ORDER — MORPHINE SULFATE (CONCENTRATE) 10 MG/0.5ML PO SOLN
5.0000 mg | ORAL | Status: DC | PRN
  Administered 2020-05-15: 5 mg via ORAL
  Filled 2020-05-15: qty 0.5

## 2020-05-15 MED ORDER — HALOPERIDOL LACTATE 5 MG/ML IJ SOLN: 0.5000 mg | INTRAMUSCULAR | Status: DC | PRN

## 2020-05-15 MED ORDER — LORAZEPAM 2 MG/ML IJ SOLN
1.0000 mg | INTRAMUSCULAR | Status: DC | PRN
  Administered 2020-05-15: 1 mg via INTRAVENOUS
  Filled 2020-05-15: qty 1

## 2020-05-15 MED ORDER — GLYCOPYRROLATE 0.2 MG/ML IJ SOLN
0.3000 mg | Freq: Three times a day (TID) | INTRAMUSCULAR | Status: DC
  Administered 2020-05-15 (×2): 0.3 mg via INTRAVENOUS
  Filled 2020-05-15 (×2): qty 2

## 2020-05-15 MED ORDER — ACETAMINOPHEN 650 MG RE SUPP: 650.0000 mg | Freq: Four times a day (QID) | RECTAL | Status: DC | PRN

## 2020-05-15 NOTE — Progress Notes (Signed)
Patient's son [name] took patient's cell phone, Games developer, and apple watch home with him. Hearing aids remain in patient's ears, hearing aid charger at bedside, and patient's glasses remain at bedside.

## 2020-05-15 NOTE — Discharge Summary (Signed)
Physician Discharge Summary  Theodore Jones:[id number] DOB: [birth year] DOA: 05/08/2020  PCP: Pcp, No  Admit date: 05/08/2020 Discharge date: 05/15/2020  Admitted From: Home Disposition: Inpatient hospice.  Recommendations for Outpatient Follow-up:  1. As per hospice facility.   Home Health: Not applicable Equipment/Devices: Not applicable  Discharge Condition: Fair CODE STATUS: DNR/comfort care Diet recommendation: Comfort feeding.  Discharge summary: 87 year old gentleman with history of obstructive sleep apnea on BiPAP, seizure disorder on Keppra, previous MI, chronic constipation, essential hypertension, hyperlipidemia and GERD brought to the emergency room due to worsening confusion, low back pain and multiple falls at home along with generalized weakness and cognitive decline for about 2 weeks.  On presentation he was hemodynamically stable.  UA was negative for pyuria.  He was found to have acute compression fracture of L1 and L2.  Followed by neurology for altered mental status.  IR was consulted and underwent kyphoplasty on 5/10.  5/12, patient had worsening clinical status with high-grade fever with no identifiable source, altered mental status, obtunded status.  Decision was made to start him on comfort care measures and referral to hospice.  #Acute metabolic encephalopathy, unclear cause. Probably multifactorial. Presented with worsening confusion and generalized weakness. This is a recurring event.  EEG showed no evidence of seizures. Initial CT scan and subsequent MRI of the brain was negative for any acute findings. Urinalysis including cultures were negative for presence of any infection. V67, TSH and folic acid and ammonia levels were normal. Neurology recommended conservative management. All-time fall precautions and delirium precautions.  Patient had worsening mental status and clinical status and ultimately decided to start comfort care and hospice on  5/13.  #Acute compression fracture L1 and L2: Underwent L1 balloon kyphoplasty on 5/10 by interventional etiology.   #Seizure disorder: Patient was on Keppra 500 mg twice a day before coming to the hospital. With altered mental status, neurology recommended increased dose of Keppra to 750 mg twice a day.  He will take as much as possible by mouth.  #Essential hypertension: Currently stable on isosorbide mononitrate.  Stable.  #Hyperlipidemia: On Zetia and rosuvastatin.  Discontinue.  #OSA on BiPAP: Rarely using.  #Goal of care: Patient with chronic multiple medical issues, failure to thrive, poor chances of recovery and rehab and currently with no meaningful oral intake. Given all his medical issues and no improvement despite optimal medical therapy, a decision was made to treat him with comfort care and hospice. All symptom control medications available. DNR/DNI and hospice. Refer to residential hospice, transfer when bed available. RN to pronounce death if happens in the hospital  Patient will be transferred to inpatient hospice when bed is available.  He will be given comfort care medications before transferring.  Less likely will be able to take any oral medications.  We will continue home medications and seizure medicine as much as possible.   Discharge Diagnoses:  Active Problems:   AMS (altered mental status)   History of seizures    Discharge Instructions  Discharge Instructions    Diet - low sodium heart healthy   Complete by: As directed    Increase activity slowly   Complete by: As directed      Allergies as of 05/15/2020   No Known Allergies     Medication List    TAKE these medications   aspirin 81 MG tablet Take 81 mg by mouth daily.   clonazePAM 1 MG tablet Commonly known as: KLONOPIN Take 1 tablet (1 mg total) by mouth  at bedtime for 5 days.   ezetimibe 10 MG tablet Commonly known as: ZETIA Take 10 mg by mouth daily.   Fish Oil 1000  MG Caps Take 1 capsule by mouth daily.   gabapentin 300 MG capsule Commonly known as: NEURONTIN Take 600 mg by mouth 3 (three) times daily.   isosorbide mononitrate 60 MG 24 hr tablet Commonly known as: IMDUR Take 60 mg by mouth daily.   latanoprost 0.005 % ophthalmic solution Commonly known as: XALATAN Place 1 drop into both eyes at bedtime.   levETIRAcetam 750 MG tablet Commonly known as: KEPPRA Take 1 tablet (750 mg total) by mouth 2 (two) times daily.   loratadine 10 MG tablet Commonly known as: CLARITIN Take 10 mg by mouth daily.   montelukast 10 MG tablet Commonly known as: SINGULAIR Take 1 tablet by mouth daily.   Multivitamin Adults Tabs Take 1 tablet by mouth daily.   omeprazole 40 MG capsule Commonly known as: PRILOSEC Take 1 capsule by mouth 2 (two) times daily.   rosuvastatin 5 MG tablet Commonly known as: CRESTOR Take 5 mg by mouth daily.   solifenacin 5 MG tablet Commonly known as: VESICARE Take 5 mg by mouth every evening.   tamsulosin 0.4 MG Caps capsule Commonly known as: FLOMAX Take 0.4 mg by mouth in the morning and at bedtime.       No Known Allergies  Consultations:  Neurology  Interventional radiology  Palliative care   Procedures/Studies: DG Lumbar Spine Complete  Result Date: 05/08/2020 CLINICAL DATA:  Back pain.  Multiple falls over the last 3 days. EXAM: LUMBAR SPINE - COMPLETE 4+ VIEW COMPARISON:  Report from 06/16/2015 FINDINGS: Aortoiliac atherosclerotic vascular disease. Reduced intervertebral disc height at L5-S1 indicative of degenerative disc disease. Degenerative facet arthropathy at L4-5 and L5-S1 bilaterally. No fracture or acute subluxation is identified. IMPRESSION: 1. No acute bony findings. 2. Degenerative disc disease at L5-S1. 3. Lower lumbar spondylosis particularly of the facet joints at L4-5 and L5-S1. 4.  Aortic Atherosclerosis (ICD10-I70.0). Electronically Signed   By: Van Clines M.D.   On: 05/08/2020  14:49   CT Head Wo Contrast  Result Date: 05/08/2020 CLINICAL DATA:  Dizziness. EXAM: CT HEAD WITHOUT CONTRAST TECHNIQUE: Contiguous axial images were obtained from the base of the skull through the vertex without intravenous contrast. COMPARISON:  February 06, 2015 FINDINGS: Brain: There is mild cerebral atrophy with widening of the extra-axial spaces and ventricular dilatation. There are areas of decreased attenuation within the white matter tracts of the supratentorial brain, consistent with microvascular disease changes. A small, chronic left para thalamic and right basal ganglia lacunar infarcts are noted. Vascular: No hyperdense vessel or unexpected calcification. Skull: Normal. Negative for fracture or focal lesion. Sinuses/Orbits: No acute finding. Other: None. IMPRESSION: 1. Generalized cerebral atrophy. 2. No acute intracranial abnormality. Electronically Signed   By: Virgina Norfolk M.D.   On: 05/08/2020 18:29   MR BRAIN WO CONTRAST  Result Date: 05/09/2020 CLINICAL DATA:  Neuro deficit, acute, stroke suspected. New onset of progressive confusion and weakness. Seizures. EXAM: MRI HEAD WITHOUT CONTRAST TECHNIQUE: Multiplanar, multiecho pulse sequences of the brain and surrounding structures were obtained without intravenous contrast. COMPARISON:  CT head without contrast 05/08/2020 FINDINGS: Brain: Moderate atrophy and advanced confluent white matter disease is present bilaterally. No acute infarct, hemorrhage, or mass lesion is present. Remote lacunar infarcts are present in the thalami bilaterally, more prominent on the right. A remote right paramedian lacunar infarct is present in the central  pons. White matter changes are present bilaterally in the central pons. Remote lacunar infarcts are present in the centrum semi ovale, right greater than left. Cerebellum is unremarkable. The internal auditory canals are within normal limits. The ventricles are proportionate to the degree of atrophy. No  significant extraaxial fluid collection is present. Vascular: Flow is present in the major intracranial arteries. Skull and upper cervical spine: The craniocervical junction is normal. Upper cervical spine is within normal limits. Marrow signal is unremarkable. Sinuses/Orbits: Mucosal thickening is present along the inferior maxillary sinuses bilaterally. Bilateral mastoid effusions are present. No obstructing lesion is present. The paranasal sinuses and mastoid air cells are otherwise clear. Bilateral lens replacements are noted. Globes and orbits are otherwise unremarkable. IMPRESSION: 1. No acute intracranial abnormality. 2. Moderate atrophy and advanced confluent white matter disease likely reflects the sequela of chronic microvascular ischemia. 3. 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 4. Bilateral mastoid effusions. No obstructing lesion is present. Electronically Signed   By: San Morelle M.D.   On: 05/09/2020 15:04   MR THORACIC SPINE WO CONTRAST  Result Date: 05/08/2020 CLINICAL DATA:  Back injury 2 days ago. Frequent falls over the last 2 weeks. Mid to low back pain. EXAM: MRI THORACIC AND LUMBAR SPINE WITHOUT CONTRAST TECHNIQUE: Multiplanar and multiecho pulse sequences of the thoracic and lumbar spine were obtained without intravenous contrast. COMPARISON:  Lumbar spine radiographs 05/08/2020. Cervical spine radiographs 01/29/2009 FINDINGS: MRI THORACIC SPINE FINDINGS Alignment:  Normal. Vertebrae: There is a chronic appearing T3 compression deformity with approximately 50% loss of vertebral body height and minimal retropulsion of the inferior endplate. There is no residual bone marrow edema. The posterior elements are intact. No acute fractures are seen within the thoracic spine. Cord: Normal in signal and caliber. Conus medullaris extends to the L2 level. Paraspinal and other soft tissues: No significant paraspinal findings. Small right greater than left  dependent pleural effusions. Disc levels: As above, mild osseous retropulsion at T3-4 related to the chronic T3 compression deformity. No cord deformity or foraminal compromise. Mild disc bulging and endplate osteophyte formation within the lower thoracic spine without significant disc herniation, spinal stenosis or nerve root encroachment. MRI LUMBAR SPINE FINDINGS Segmentation:  There are 5 lumbar type vertebral bodies. Alignment:  Physiologic. Vertebrae: There is superior endplate irregularity at L1 with associated linear low signal and surrounding marrow edema in the L1 vertebral body, suspicious for an acute compression deformity. There is also mild marrow edema within the anterior superior corner of the L2 vertebral body which could reflect an additional fracture or degenerative edema. The posterior elements are intact. There is no osseous retropulsion. Conus medullaris and cauda equina: Conus extends to the L2 level. Conus and cauda equina appear normal. Paraspinal and other soft tissues: Mild paraspinous edema anteriorly at L1, supporting an acute fracture. Disc levels: L1-2: Loss of disc height with mild disc bulging and facet hypertrophy. No spinal stenosis or nerve root encroachment. L2-3: No significant findings. L3-4: Mild loss of disc height with annular disc bulging and facet hypertrophy. No significant spinal stenosis or nerve root encroachment. L4-5: Mild loss of disc height with annular disc bulging, facet and ligamentous hypertrophy. Mild lateral recess and left foraminal narrowing without definite nerve root encroachment. L5-S1: Broad-based disc protrusion with covering spur in the right subarticular zone and medial foramen. This causes asymmetric narrowing of the right lateral recess and possible right S1 nerve root encroachment. In addition, there is mild right foraminal narrowing. Mild  bilateral facet hypertrophy. IMPRESSION: 1. Probable acute mild superior endplate compression fracture at L1  with associated marrow edema. 2. Possible minimal superior endplate compression fracture at L2. 3. No acute findings in the thoracic spine. Old healed T3 compression deformity. 4. Lumbar spondylosis as described with disc bulging, endplate osteophytes and facet hypertrophy. At L4-5, the lateral recesses and left foramen are mildly narrowed. At L5-S1, there is asymmetric right lateral recess and right foraminal narrowing. Electronically Signed   By: Richardean Sale M.D.   On: 05/08/2020 20:09   MR LUMBAR SPINE WO CONTRAST  Result Date: 05/08/2020 CLINICAL DATA:  Back injury 2 days ago. Frequent falls over the last 2 weeks. Mid to low back pain. EXAM: MRI THORACIC AND LUMBAR SPINE WITHOUT CONTRAST TECHNIQUE: Multiplanar and multiecho pulse sequences of the thoracic and lumbar spine were obtained without intravenous contrast. COMPARISON:  Lumbar spine radiographs 05/08/2020. Cervical spine radiographs 01/29/2009 FINDINGS: MRI THORACIC SPINE FINDINGS Alignment:  Normal. Vertebrae: There is a chronic appearing T3 compression deformity with approximately 50% loss of vertebral body height and minimal retropulsion of the inferior endplate. There is no residual bone marrow edema. The posterior elements are intact. No acute fractures are seen within the thoracic spine. Cord: Normal in signal and caliber. Conus medullaris extends to the L2 level. Paraspinal and other soft tissues: No significant paraspinal findings. Small right greater than left dependent pleural effusions. Disc levels: As above, mild osseous retropulsion at T3-4 related to the chronic T3 compression deformity. No cord deformity or foraminal compromise. Mild disc bulging and endplate osteophyte formation within the lower thoracic spine without significant disc herniation, spinal stenosis or nerve root encroachment. MRI LUMBAR SPINE FINDINGS Segmentation:  There are 5 lumbar type vertebral bodies. Alignment:  Physiologic. Vertebrae: There is superior  endplate irregularity at L1 with associated linear low signal and surrounding marrow edema in the L1 vertebral body, suspicious for an acute compression deformity. There is also mild marrow edema within the anterior superior corner of the L2 vertebral body which could reflect an additional fracture or degenerative edema. The posterior elements are intact. There is no osseous retropulsion. Conus medullaris and cauda equina: Conus extends to the L2 level. Conus and cauda equina appear normal. Paraspinal and other soft tissues: Mild paraspinous edema anteriorly at L1, supporting an acute fracture. Disc levels: L1-2: Loss of disc height with mild disc bulging and facet hypertrophy. No spinal stenosis or nerve root encroachment. L2-3: No significant findings. L3-4: Mild loss of disc height with annular disc bulging and facet hypertrophy. No significant spinal stenosis or nerve root encroachment. L4-5: Mild loss of disc height with annular disc bulging, facet and ligamentous hypertrophy. Mild lateral recess and left foraminal narrowing without definite nerve root encroachment. L5-S1: Broad-based disc protrusion with covering spur in the right subarticular zone and medial foramen. This causes asymmetric narrowing of the right lateral recess and possible right S1 nerve root encroachment. In addition, there is mild right foraminal narrowing. Mild bilateral facet hypertrophy. IMPRESSION: 1. Probable acute mild superior endplate compression fracture at L1 with associated marrow edema. 2. Possible minimal superior endplate compression fracture at L2. 3. No acute findings in the thoracic spine. Old healed T3 compression deformity. 4. Lumbar spondylosis as described with disc bulging, endplate osteophytes and facet hypertrophy. At L4-5, the lateral recesses and left foramen are mildly narrowed. At L5-S1, there is asymmetric right lateral recess and right foraminal narrowing. Electronically Signed   By: Richardean Sale M.D.   On:  05/08/2020 20:09  DG CHEST PORT 1 VIEW  Result Date: 05/14/2020 CLINICAL DATA:  History of recent kyphoplasty with fevers, initial encounter EXAM: PORTABLE CHEST 1 VIEW COMPARISON:  None. FINDINGS: Cardiac shadow is enlarged in size. Aortic calcifications are noted. The lungs are well aerated bilaterally. No focal infiltrate is seen. Changes of prior L1 kyphoplasty are noted. No acute bony abnormality is seen. IMPRESSION: No acute abnormality noted. Electronically Signed   By: Inez Catalina M.D.   On: 05/14/2020 16:24   EEG adult  Result Date: 05/09/2020 Lora Havens, MD     05/09/2020 10:58 AM Patient Name: Hillary Abarca MRN: [id number] Epilepsy Attending: Lora Havens Referring Physician/Provider: Dr Irene Pap Date: 05/09/2020 Duration: 28.29 mins Patient history: 87yo M with ams. EEG to evaluate for seizure Level of alertness: Awake AEDs during EEG study: LEV Technical aspects: This EEG study was done with scalp electrodes positioned according to the 10-20 International system of electrode placement. Electrical activity was acquired at a sampling rate of 500Hz  and reviewed with a high frequency filter of 70Hz  and a low frequency filter of 1Hz . EEG data were recorded continuously and digitally stored. Description: The posterior dominant rhythm consists of 8 Hz activity of moderate voltage (25-35 uV) seen predominantly in posterior head regions, symmetric and reactive to eye opening and eye closing. EEG showed intermittent generalized 3 to 6 Hz theta-delta slowing. Hyperventilation and photic stimulation were not performed.   ABNORMALITY - Intermittent slow, generalized IMPRESSION: This study is suggestive of mild diffuse encephalopathy, nonspecific etiology. No seizures or epileptiform discharges were seen throughout the recording. Priyanka O Yadav   IR KYPHO LUMBAR INC FX REDUCE BONE BX UNI/BIL CANNULATION INC/IMAGING  Result Date: 05/12/2020 INDICATION: 87 year old male with past medical  history significant for obstructive sleep apnea, seizure, CAD, hypertension, hyperlipidemia admitted recently for altered mental status and severe low back pain in the setting of recent fall. MRI of the lumbar spine showed an acute compression fracture of the L1 vertebral body. He comes to our service today for a fluoroscopy guided kyphoplasty. EXAM: FLUOROSCOPY GUIDED L1 CORE BONE BIOPSY AND BILOMA KYPHOPLASTY COMPARISON:  MRI of the lumbar spine May 08, 2020. MEDICATIONS: As antibiotic prophylaxis, Ancef 2 gm IV. ANESTHESIA/SEDATION: Moderate (conscious) sedation was employed during this procedure. A total of Versed 1 mg and Fentanyl 50 mcg was administered intravenously. Moderate Sedation Time: 56 minutes. The patient's level of consciousness and vital signs were monitored continuously by radiology nursing throughout the procedure under my direct supervision. FLUOROSCOPY TIME:  Fluoroscopy Time: 21 minutes 48 seconds (0000000 mGy) COMPLICATIONS: None immediate. PROCEDURE: Following a full explanation of the procedure along with the potential associated complications, an informed witnessed consent was obtained from patient's son. The patient was placed in prone position on the angiography table. The lumbar spine region was prepped and draped in a sterile fashion. Under fluoroscopy, the L1 vertebral body was delineated and the skin area was marked. The skin was infiltrated with a 1% Lidocaine approximately 3.2 cm lateral to the spinous process projection on the right. Using a 22-gauge spinal needle, the soft issue and the peripedicular space and periosteum were infiltrated with Bupivacaine 0.5%. A skin incision was made at the access site. Subsequently, an 11-gauge Kyphon trocar was inserted under fluoroscopic guidance until contact with the pedicle was obtained. The trocar was inserted under light hammer tapping into the pedicle until the posterior boundaries of the vertebral body was reached. The diamond mandrill was  removed and one core biopsy wasobtained. The skin  was infiltrated with a 1% Lidocaine approximately 3.2 cm lateral to the spinous process projection on the left. Using a 22-gauge spinal needle, the soft issue and the peripedicular space and periosteum were infiltrated with Bupivacaine 0.5%. A skin incision was made at the access site. Subsequently, an 11-gauge Kyphon trocar was inserted under fluoroscopic guidance until contact with the pedicle was obtained. The trocar was inserted under light hammer tapping into the pedicle until the posterior boundaries of the vertebral body was reached. The diamond mandrill was removed. A bone drill was coaxially advanced within the anterior third of the vertebral body and then exchanged for inflatable Kyphon balloons. These were centered within the mid-aspect of the vertebral body. The balloons were inflated to create a void to serve as a repository for the bone cement. Both balloons were deflated and through both cannulas, under continuous fluoroscopy guidance in the AP and lateral views, the vertebral body was filled with previously mixed polymethyl-methacrylate (PMMA) added to barium for opacification. Both cannulas were later removed. Minimal cement extravasation into the T12-L1 disc space seen. No epidural venous contamination was seen. The access sites were cleaned and covered with a sterile bandage. IMPRESSION: Successful fluoroscopy-guided bilateral transpedicular approach for L1 vertebral body core bone biopsy and kyphoplasty for treatment of osteoporotic fragility fracture. Bone samples obtained were sent for pathology analysis. Electronically Signed   By: Pedro Earls M.D.   On: 05/12/2020 11:42   (Echo, Carotid, EGD, Colonoscopy, ERCP)    Subjective: Patient is extremely lethargic and not participating.  Febrile overnight.  Discharge Exam: Vitals:   05/15/20 0300 05/15/20 0627  BP: (!) 126/54 (!) 123/52  Pulse: 85 83  Resp: (!) 27 19   Temp: 98.6 F (37 C) 98 F (36.7 C)  SpO2: 96% 100%   Vitals:   05/14/20 1901 05/14/20 2300 05/15/20 0300 05/15/20 0627  BP: (!) 149/64 134/69 (!) 126/54 (!) 123/52  Pulse: 100 87 85 83  Resp: (!) 29 (!) 28 (!) 27 19  Temp: (!) 100.5 F (38.1 C) 98.7 F (37.1 C) 98.6 F (37 C) 98 F (36.7 C)  TempSrc: Oral Axillary Axillary Axillary  SpO2: 97% 97% 96% 100%    General exam: Sick looking.  Obtunded.  Noisy respiration. Response to deep stimulation only, otherwise somnolent and sleepy. Respiratory system: Conducted airway sounds.  In mild distress.  On room air. Cardiovascular system: S1 & S2 heard, tachycardia.Marland Kitchen No JVD, murmurs, rubs, gallops or clicks. No pedal edema. Gastrointestinal system: Soft and nontender   The results of significant diagnostics from this hospitalization (including imaging, microbiology, ancillary and laboratory) are listed below for reference.     Microbiology: Recent Results (from the past 240 hour(s))  Resp Panel by RT-PCR (Flu A&B, Covid) Nasopharyngeal Swab     Status: None   Collection Time: 05/08/20 10:46 PM   Specimen: Nasopharyngeal Swab; Nasopharyngeal(NP) swabs in vial transport medium  Result Value Ref Range Status   SARS Coronavirus 2 by RT PCR NEGATIVE NEGATIVE Final    Comment: (NOTE) SARS-CoV-2 target nucleic acids are NOT DETECTED.  The SARS-CoV-2 RNA is generally detectable in upper respiratory specimens during the acute phase of infection. The lowest concentration of SARS-CoV-2 viral copies this assay can detect is 138 copies/mL. A negative result does not preclude SARS-Cov-2 infection and should not be used as the sole basis for treatment or other patient management decisions. A negative result may occur with  improper specimen collection/handling, submission of specimen other than nasopharyngeal swab, presence  of viral mutation(s) within the areas targeted by this assay, and inadequate number of viral copies(<138 copies/mL).  A negative result must be combined with clinical observations, patient history, and epidemiological information. The expected result is Negative.  Fact Sheet for Patients:  EntrepreneurPulse.com.au  Fact Sheet for Healthcare Providers:  IncredibleEmployment.be  This test is no t yet approved or cleared by the [place] FDA and  has been authorized for detection and/or diagnosis of SARS-CoV-2 by FDA under an Emergency Use Authorization (EUA). This EUA will remain  in effect (meaning this test can be used) for the duration of the COVID-19 declaration under Section 564(b)(1) of the Act, 21 U.S.C.section 360bbb-3(b)(1), unless the authorization is terminated  or revoked sooner.       Influenza A by PCR NEGATIVE NEGATIVE Final   Influenza B by PCR NEGATIVE NEGATIVE Final    Comment: (NOTE) The Xpert Xpress SARS-CoV-2/FLU/RSV plus assay is intended as an aid in the diagnosis of influenza from Nasopharyngeal swab specimens and should not be used as a sole basis for treatment. Nasal washings and aspirates are unacceptable for Xpert Xpress SARS-CoV-2/FLU/RSV testing.  Fact Sheet for Patients: EntrepreneurPulse.com.au  Fact Sheet for Healthcare Providers: IncredibleEmployment.be  This test is not yet approved or cleared by the [place] FDA and has been authorized for detection and/or diagnosis of SARS-CoV-2 by FDA under an Emergency Use Authorization (EUA). This EUA will remain in effect (meaning this test can be used) for the duration of the COVID-19 declaration under Section 564(b)(1) of the Act, 21 U.S.C. section 360bbb-3(b)(1), unless the authorization is terminated or revoked.  Performed at [facility], [place] [address], [place], [place] [address]   SARS CORONAVIRUS 2 (TAT 6-24 HRS) Nasopharyngeal Nasopharyngeal Swab     Status: None   Collection Time: 05/14/20  2:21 PM   Specimen:  Nasopharyngeal Swab  Result Value Ref Range Status   SARS Coronavirus 2 NEGATIVE NEGATIVE Final    Comment: (NOTE) SARS-CoV-2 target nucleic acids are NOT DETECTED.  The SARS-CoV-2 RNA is generally detectable in upper and lower respiratory specimens during the acute phase of infection. Negative results do not preclude SARS-CoV-2 infection, do not rule out co-infections with other pathogens, and should not be used as the sole basis for treatment or other patient management decisions. Negative results must be combined with clinical observations, patient history, and epidemiological information. The expected result is Negative.  Fact Sheet for Patients: SugarRoll.be  Fact Sheet for Healthcare Providers: https://www.woods-mathews.com/  This test is not yet approved or cleared by the [place] FDA and  has been authorized for detection and/or diagnosis of SARS-CoV-2 by FDA under an Emergency Use Authorization (EUA). This EUA will remain  in effect (meaning this test can be used) for the duration of the COVID-19 declaration under Se ction 564(b)(1) of the Act, 21 U.S.C. section 360bbb-3(b)(1), unless the authorization is terminated or revoked sooner.  Performed at [facility], [place] [address], [place], [place] [address]      Labs: BNP (last 3 results) No results for input(s): BNP in the last 8760 hours. Basic Metabolic Panel: Recent Labs  Lab 05/09/20 0315 05/10/20 0013 05/13/20 0413 05/15/20 0423  NA 138 134* 134*  --   K 3.8 3.9 3.9  --   CL 106 100 100  --   CO2 26 24 24   --   GLUCOSE 112* 105* 129*  --   BUN 13 14 16   --   CREATININE 0.87 0.92 0.99  1.11  CALCIUM 8.8* 8.9 8.3*  --   MG 2.1 1.9 2.0  --   PHOS 3.6  --   --   --    Liver Function Tests: Recent Labs  Lab 05/10/20 0013  AST 22  ALT 12  ALKPHOS 45  BILITOT 1.3*  PROT 6.7  ALBUMIN 3.5   No results for input(s): LIPASE, AMYLASE in the last 168  hours. Recent Labs  Lab 05/10/20 0013  AMMONIA 27   CBC: Recent Labs  Lab 05/10/20 0013 05/13/20 0413 05/15/20 0423  WBC 9.5 8.7 15.3*  NEUTROABS 6.3 6.0 11.8*  HGB 15.1 13.7 12.8*  HCT 45.2 40.8 39.6  MCV 87.4 86.6 89.6  PLT 167 192 176   Cardiac Enzymes: No results for input(s): CKTOTAL, CKMB, CKMBINDEX, TROPONINI in the last 168 hours. BNP: Invalid input(s): POCBNP CBG: No results for input(s): GLUCAP in the last 168 hours. D-Dimer No results for input(s): DDIMER in the last 72 hours. Hgb A1c No results for input(s): HGBA1C in the last 72 hours. Lipid Profile No results for input(s): CHOL, HDL, LDLCALC, TRIG, CHOLHDL, LDLDIRECT in the last 72 hours. Thyroid function studies No results for input(s): TSH, T4TOTAL, T3FREE, THYROIDAB in the last 72 hours.  Invalid input(s): FREET3 Anemia work up No results for input(s): VITAMINB12, FOLATE, FERRITIN, TIBC, IRON, RETICCTPCT in the last 72 hours. Urinalysis    Component Value Date/Time   COLORURINE AMBER (A) 05/14/2020 2200   APPEARANCEUR HAZY (A) 05/14/2020 2200   LABSPEC 1.024 05/14/2020 2200   PHURINE 5.0 05/14/2020 2200   GLUCOSEU NEGATIVE 05/14/2020 2200   HGBUR LARGE (A) 05/14/2020 2200   BILIRUBINUR NEGATIVE 05/14/2020 2200   KETONESUR 20 (A) 05/14/2020 2200   PROTEINUR >=300 (A) 05/14/2020 2200   NITRITE NEGATIVE 05/14/2020 2200   LEUKOCYTESUR MODERATE (A) 05/14/2020 2200   Sepsis Labs Invalid input(s): PROCALCITONIN,  WBC,  LACTICIDVEN Microbiology Recent Results (from the past 240 hour(s))  Resp Panel by RT-PCR (Flu A&B, Covid) Nasopharyngeal Swab     Status: None   Collection Time: 05/08/20 10:46 PM   Specimen: Nasopharyngeal Swab; Nasopharyngeal(NP) swabs in vial transport medium  Result Value Ref Range Status   SARS Coronavirus 2 by RT PCR NEGATIVE NEGATIVE Final    Comment: (NOTE) SARS-CoV-2 target nucleic acids are NOT DETECTED.  The SARS-CoV-2 RNA is generally detectable in upper  respiratory specimens during the acute phase of infection. The lowest concentration of SARS-CoV-2 viral copies this assay can detect is 138 copies/mL. A negative result does not preclude SARS-Cov-2 infection and should not be used as the sole basis for treatment or other patient management decisions. A negative result may occur with  improper specimen collection/handling, submission of specimen other than nasopharyngeal swab, presence of viral mutation(s) within the areas targeted by this assay, and inadequate number of viral copies(<138 copies/mL). A negative result must be combined with clinical observations, patient history, and epidemiological information. The expected result is Negative.  Fact Sheet for Patients:  EntrepreneurPulse.com.au  Fact Sheet for Healthcare Providers:  IncredibleEmployment.be  This test is no t yet approved or cleared by the [place] FDA and  has been authorized for detection and/or diagnosis of SARS-CoV-2 by FDA under an Emergency Use Authorization (EUA). This EUA will remain  in effect (meaning this test can be used) for the duration of the COVID-19 declaration under Section 564(b)(1) of the Act, 21 U.S.C.section 360bbb-3(b)(1), unless the authorization is terminated  or revoked sooner.       Influenza A  by PCR NEGATIVE NEGATIVE Final   Influenza B by PCR NEGATIVE NEGATIVE Final    Comment: (NOTE) The Xpert Xpress SARS-CoV-2/FLU/RSV plus assay is intended as an aid in the diagnosis of influenza from Nasopharyngeal swab specimens and should not be used as a sole basis for treatment. Nasal washings and aspirates are unacceptable for Xpert Xpress SARS-CoV-2/FLU/RSV testing.  Fact Sheet for Patients: EntrepreneurPulse.com.au  Fact Sheet for Healthcare Providers: IncredibleEmployment.be  This test is not yet approved or cleared by the [place] FDA and has been  authorized for detection and/or diagnosis of SARS-CoV-2 by FDA under an Emergency Use Authorization (EUA). This EUA will remain in effect (meaning this test can be used) for the duration of the COVID-19 declaration under Section 564(b)(1) of the Act, 21 U.S.C. section 360bbb-3(b)(1), unless the authorization is terminated or revoked.  Performed at [facility], [place] [address], [place], [place] [address]   SARS CORONAVIRUS 2 (TAT 6-24 HRS) Nasopharyngeal Nasopharyngeal Swab     Status: None   Collection Time: 05/14/20  2:21 PM   Specimen: Nasopharyngeal Swab  Result Value Ref Range Status   SARS Coronavirus 2 NEGATIVE NEGATIVE Final    Comment: (NOTE) SARS-CoV-2 target nucleic acids are NOT DETECTED.  The SARS-CoV-2 RNA is generally detectable in upper and lower respiratory specimens during the acute phase of infection. Negative results do not preclude SARS-CoV-2 infection, do not rule out co-infections with other pathogens, and should not be used as the sole basis for treatment or other patient management decisions. Negative results must be combined with clinical observations, patient history, and epidemiological information. The expected result is Negative.  Fact Sheet for Patients: SugarRoll.be  Fact Sheet for Healthcare Providers: https://www.woods-mathews.com/  This test is not yet approved or cleared by the [place] FDA and  has been authorized for detection and/or diagnosis of SARS-CoV-2 by FDA under an Emergency Use Authorization (EUA). This EUA will remain  in effect (meaning this test can be used) for the duration of the COVID-19 declaration under Se ction 564(b)(1) of the Act, 21 U.S.C. section 360bbb-3(b)(1), unless the authorization is terminated or revoked sooner.  Performed at [facility], [place] [address], [place], [place] [address]      Time coordinating discharge:  32 minutes  SIGNED:   Barb Merino, MD  [facility] 05/15/2020, 2:29 PM

## 2020-05-15 NOTE — TOC Progression Note (Signed)
Transition of Care [facility]) - Progression Note    Patient Details  Name: Theodore Jones MRN: [id number] Date of Birth: [birth year]  Transition of Care [facility]) CM/SW Girard, Shaniko Phone Number: 05/15/2020, 2:04 PM  Clinical Narrative:     Pt is comfort care. CSW spoke with pt son [name]. Confirmed plan for hospice facility. He prefers [facility].   CSW made referral to [facility] with [facility]. She will review. Possible bed available today. She will follow up with [name] and update CSW on bed availability.   Expected Discharge Plan: [place]    Expected Discharge Plan and Services Expected Discharge Plan: [place]   Discharge Planning Services: CM Consult   Living arrangements for the past 2 months: Single Family Home Expected Discharge Date: 05/14/20               DME Arranged: N/A DME Agency: NA       HH Arranged: NA           Social Determinants of Health (SDOH) Interventions    Readmission Risk Interventions No flowsheet data found.

## 2020-05-15 NOTE — Consult Note (Signed)
Consultation Note Date: 05/15/2020   Patient Name: Theodore Jones  DOB: [birth year]  MRN: [id number]  Age / Sex: 87 y.o., male   PCP: Pcp, No Referring Physician: Barb Merino, MD   REASON FOR CONSULTATION:Establishing goals of care  Palliative Care consult requested for goals of care discussion in this 87 y.o. male with a medical history significant for OSA, on BiPAP followed by pulmonary, seizure disorder, previous MI, coronary artery disease, chronic constipation, essential hypertension, hyperlipidemia, and GERD. Patient presented to the ED from home with complaints of recurrent falls and confusion. Son reported worsening cognitive decline 2 weeks prior to admission. Since admission patient underwent L1 balloon kyphoplasty on 5/10 due to acute compression fracture of L1 and L2. He was initially planned for discharge and unfortunately experienced further decline on 05/14/2020.   Clinical Assessment and Goals of Care: I have reviewed medical records including lab results, imaging, Epic notes, and MAR, received report from the bedside RN, and assessed the patient.   I met at the bedside with patient's wife and son, Theodore Jones (daughter via speaker phone) to discuss diagnosis prognosis, [place], EOL wishes, disposition and options.  [name] is obtunded with audible secretions.   I introduced Palliative Medicine as specialized medical care for people living with serious illness. It focuses on providing relief from the symptoms and stress of a serious illness. The goal is to improve quality of life for both the patient and the family.  We discussed a brief life review of the patient, along with his functional and nutritional status. Patient and wife have been married for over 40 years. They have 3 children. 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 He is of [place] faith.   Prior to admission patient was ambulatory with a walker however has  declined due to recent falls and weakness. Family shares patient has become more fatigued and would often spend most of his time sleeping or sitting in chair watching television. He requires assistance with ADLs.   We discussed His current illness and what it means in the larger context of His on-going co-morbidities. Natural disease trajectory and expectations at EOL were discussed.  Son and wife verbalizes their understanding of patient's decline since admission and also prior to admission. Wife shares quality of life is very important to them over quantity and [name] has always been clear about expressing his wishes. Family shares patient would never want to be in a skilled facility.   A detailed discussion was had today regarding advanced directives.  Concepts specific to code status, artifical feeding and hydration, continued IV antibiotics and rehospitalization.  The difference between a aggressive medical intervention and a palliative comfort care path were discussed at length.   Values and goals of care important to patient and family were attempted to be elicited.   Family is clear in expressed wishes to transition all care to focus on his comfort only. Extensive education provided on comfort care while hospitalized. Family mutually verbalized understanding confirming DNR/DNI and wishes for comfort to allow patient to have minimal suffering and spend what time he has left with family.   Spiritual support offered for emotional support. Family appreciative.    Hospice services outpatient were explained and offered. Family verbalized their understanding and awareness of  hospice's goals and philosophy of care. We discussed home with hospice support versus hospice home. Family is requesting consideration for residential hospice with preferred location of  [place].   Questions and concerns were addressed. The family was encouraged to call with questions or concerns.  PMT will continue  to support holistically as needed.   CODE STATUS: DNR  ADVANCE DIRECTIVES: Primary Decision Maker:  Son Theodore Jones ([place])     SYMPTOM MANAGEMENT: see below   Palliative Prophylaxis:   Aspiration, Bowel Regimen, Delirium Protocol, Eye Care, Frequent Pain Assessment, Oral Care and Turn Reposition  PSYCHO-SOCIAL/SPIRITUAL:  Support System: Family  Desire for further Chaplaincy support: Yes   Additional Recommendations (Limitations, Scope, Preferences):  Full Comfort Care  Education on hospice/palliative    PAST MEDICAL HISTORY: Past Medical History:  Diagnosis Date  . CAD (coronary artery disease)   . Esophageal reflux   . Heart disease   . History of PTCA   . Hodgkin lymphoma ([place])    chemo  . HTN (hypertension)   . Other and unspecified hyperlipidemia     ALLERGIES:  has No Known Allergies.   MEDICATIONS:  Current Facility-Administered Medications  Medication Dose Route Frequency Provider Last Rate Last Admin  . acetaminophen (OFIRMEV) IV 1,000 mg  1,000 mg Intravenous Q6H Barb Merino, MD 400 mL/hr at 05/15/20 0604 1,000 mg at 05/15/20 0604  . acetaminophen (TYLENOL) suppository 650 mg  650 mg Rectal Q6H PRN Pickenpack-Cousar, Azazel Franze N, NP      . antiseptic oral rinse (BIOTENE) solution 15 mL  15 mL Topical PRN Pickenpack-Cousar, Renn Dirocco N, NP      . glycopyrrolate (ROBINUL) injection 0.3 mg  0.3 mg Intravenous TID Pickenpack-Cousar, Josselyne Onofrio N, NP      . haloperidol lactate (HALDOL) injection 0.5 mg  0.5 mg Intravenous Q4H PRN Pickenpack-Cousar, Jubilee Vivero N, NP      . levETIRAcetam (KEPPRA) tablet 750 mg  750 mg Oral BID Kerney Elbe, MD   750 mg at 05/14/20 2216  . LORazepam (ATIVAN) injection 1 mg  1 mg Intravenous Q4H PRN Pickenpack-Cousar, Tracie Lindbloom N, NP      . morphine 2 MG/ML injection 1-2 mg  1-2 mg Intravenous Q1H PRN Pickenpack-Cousar, Mirai Greenwood N, NP      . morphine CONCENTRATE 10 MG/0.5ML oral solution 5 mg  5 mg Oral Q2H PRN Pickenpack-Cousar, Harnoor Kohles N,  NP       Or  . morphine CONCENTRATE 10 MG/0.5ML oral solution 5 mg  5 mg Sublingual Q2H PRN Pickenpack-Cousar, Jennfier Abdulla N, NP      . ondansetron (ZOFRAN) injection 4 mg  4 mg Intravenous Q6H PRN Pickenpack-Cousar, Latoyia Tecson N, NP      . polyvinyl alcohol (LIQUIFILM TEARS) 1.4 % ophthalmic solution 1 drop  1 drop Both Eyes QID PRN Aline August, MD   1 drop at 05/12/20 0816  . scopolamine (TRANSDERM-SCOP) 1 MG/3DAYS 1.5 mg  1 patch Transdermal Q72H Pickenpack-Cousar, Shae Augello N, NP      . sodium chloride flush (NS) 0.9 % injection 10-40 mL  10-40 mL Intracatheter PRN Irene Pap N, DO      . tamsulosin (FLOMAX) capsule 0.4 mg  0.4 mg Oral Daily Hall, Carole N, DO   0.4 mg at 05/14/20 0956    VITAL SIGNS: BP (!) 123/52 (BP Location: Right Arm)   Pulse 83   Temp 98 F (36.7 C) (Axillary)   Resp 19   SpO2 100%  There were no vitals filed for this visit.  There is no height or weight on file to calculate BMI.  LABS: CBC:    Component Value Date/Time   WBC 15.3 (H) 05/15/2020 0423  HGB 12.8 (L) 05/15/2020 0423   HCT 39.6 05/15/2020 0423   PLT 176 05/15/2020 0423   Comprehensive Metabolic Panel:    Component Value Date/Time   NA 134 (L) 05/13/2020 0413   K 3.9 05/13/2020 0413   BUN 16 05/13/2020 0413   CREATININE 1.11 05/15/2020 0423   ALBUMIN 3.5 05/10/2020 0013     Review of Systems  Unable to perform ROS: Acuity of condition   Unless otherwise noted, a complete review of systems is negative.  Physical Exam General: increased work in breathing, frail chronically-ill appearing Cardiovascular: tachycardic  Pulmonary: rhonchi, audible secretions  Abdomen: soft, nontender, + bowel sounds Neurological: obtunded   Prognosis: < 2 weeks in the setting of encephalopathy, generalized weakness, compression fracture L1, L2, seizure disorder, respiratory failure, high grade fever, poor nutrition, obtunded  Discharge Planning:  Hospice facility  Recommendations: . DNR/DNI-as  confirmed by family . Family has requested to transition all care to focus on comfort/EOL . Extensive discussion with family regarding current illness, decline, and prognosis. Family has mutually agreed and verbalized patient's decline in quality of life. They are not interested in further work-up or medical interventions.  . Minimize medications to focus solely on comfort . TOC referral for [facility] (residential), [place], [employer] notified.  Morphine PRN for pain/air hunger/comfort Robinul PRN for excessive secretions Ativan PRN for agitation/anxiety Zofran PRN for nausea Liquifilm tears PRN for dry eyes Haldol PRN for agitation/anxiety May have comfort feeding Comfort cart for family Unrestricted visitations in the setting of EOL (per policy) Oxygen PRN 2L or less for comfort. No escalation.  Marland Kitchen PMT will continue to support and follow as needed. Please call team line with urgent needs.   Palliative Performance Scale: PPS 10%              Family expressed understanding and was in agreement with this plan.   Thank you for allowing the Palliative Medicine Team to assist in the care of this patient. Please utilize secure chat with additional questions, if there is no response within 30 minutes please call the above phone number.   Time In: 0950 Time Out: 1100 Time Total: 70 min.   Visit consisted of counseling and education dealing with the complex and emotionally intense issues of symptom management and palliative care in the setting of serious and potentially life-threatening illness.Greater than 50%  of this time was spent counseling and coordinating care related to the above assessment and plan.  Signed by:  Alda Lea, AGPCNP-BC Palliative Medicine Team  Phone: [phone] Pager: [phone] Amion: [employer] Team providers are available by phone from 7am to 7pm daily and can be reached through the team cell phone.  Should  this patient require assistance outside of these hours, please call the patient's attending physician.

## 2020-05-15 NOTE — Progress Notes (Signed)
Patient just left [facility] to [facility] in [place] via [place].   Discharge packet given to [facility] staff. Patient's daughter also left [facility] as soon as PTAR left [facility].  Discharge report was earlier given by day shift RN to receiving facility earlier. Patient's VS is stable.

## 2020-05-15 NOTE — TOC Transition Note (Signed)
Transition of Care [facility]) - CM/SW Discharge Note   Patient Details  Name: Theodore Jones MRN: [id number] Date of Birth: 02/25/1931  [facility]) CM/SW Contact:  Bethann Berkshire, [place] Phone Number: 05/15/2020, 4:23 PM   Clinical Narrative:     Patient will DC to: [facility] [place] Anticipated DC date: 05/15/20 Family notified: Anselm Pancoast  Transport by: Corey Harold   Per MD patient ready for DC to [facility]. RN, patient, patient's family, and facility notified of DC. Discharge Summary and FL2 sent to facility. RN to call report prior to discharge [phone]). DC packet on chart. Ambulance transport requested for patient.   CSW will sign off for now as social work intervention is no longer needed. Please consult [place] again if new needs arise.   Final next level of care: [place] Barriers to Discharge: No Barriers Identified   Patient Goals and CMS Choice        Discharge Placement              Patient chooses bed at:  [facility]) Patient to be transferred to facility by: [place] Name of family member notified: Tyaire Odem Patient and family notified of of transfer: 05/15/20  Discharge Plan and Services   Discharge Planning Services: CM Consult            DME Arranged: N/A DME Agency: NA       HH Arranged: NA          Social Determinants of Health (SDOH) Interventions     Readmission Risk Interventions No flowsheet data found.

## 2020-05-15 NOTE — Progress Notes (Signed)
PT Cancellation/Discharge Note  Patient Details Name: Theodore Jones MRN: [id number] DOB: January 02, 1932   Cancelled Treatment:    Reason Eval/Treat Not Completed: Medical issues which prohibited therapy.  PT orders d/c'd.  Noted orders for comfort care.  PT to sign off.  Please re-order if/when appropriate.   Thanks,  Verdene Lennert, PT, DPT  Acute Rehabilitation [phone] pager #[phone] office       Wells Guiles B Aviya Jarvie 05/15/2020, 1:01 PM

## 2020-05-15 NOTE — Progress Notes (Signed)
[name], patient's son found the patient's phone tucked into the side of the bed.

## 2020-05-15 NOTE — Progress Notes (Signed)
Sonda Primes to be D/C'd per MD order. Discussed with the patient's family and all questions fully answered. ? VSS, Skin clean, dry and intact without evidence of skin break down, no evidence of skin tears noted. ? IV catheter remains intact for DC to Hospice. Site without signs and symptoms of complications. Dressing clean dry and intact. ? An After Visit Summary was printed and placed in the discharge packet.  ? D/C education completed with receiving facility, all questions fully answered.  ? Patient to be escorted via stretcher, and D/C to Hospice via [place].

## 2020-05-15 NOTE — Progress Notes (Addendum)
This chaplain attempted EOL spiritual care visit. Family has left the room and the Pt. is resting comfortably.  This chaplain will F/U with spiritual care.  **[phone]  This chaplain phoned the unit. The Pt. Family has not returned to the room.

## 2020-05-15 NOTE — Progress Notes (Signed)
PROGRESS NOTE    Theodore Jones  WUJ:[id number] DOB: 29-May-1931 DOA: 05/08/2020 PCP: Pcp, No    Brief Narrative:  87 year old gentleman with history of obstructive sleep apnea on BiPAP, seizure disorder on Keppra, previous MI, chronic constipation, essential hypertension, hyperlipidemia and GERD brought to the emergency room due to worsening confusion, low back pain and multiple falls at home along with generalized weakness and cognitive decline for about 2 weeks.  On presentation he was hemodynamically stable.  UA was negative for pyuria.  He was found to have acute compression fracture of L1 and L2.  Followed by neurology for altered mental status.  IR was consulted and underwent kyphoplasty on 5/10.  5/12, patient had worsening clinical status with high-grade fever with no identifiable source, altered mental status, obtunded status.  Decision was made to start him on comfort care measures and referral to hospice.   Assessment & Plan:   Active Problems:   AMS (altered mental status)   History of seizures  # Acute metabolic encephalopathy, unclear cause. Probably multifactorial. Presented with worsening confusion and generalized weakness. This is a recurring event.  EEG showed no evidence of seizures. Initial CT scan and subsequent MRI of the brain was negative for any acute findings. Urinalysis including cultures were negative for presence of any infection. N56, TSH and folic acid and ammonia levels were normal. Neurology recommended conservative management. All-time fall precautions and delirium precautions.  Patient had worsening mental status and clinical status and ultimately decided to start comfort care and hospice on 5/13.  # Acute compression fracture L1 and L2: Underwent L1 balloon kyphoplasty on 5/10 by interventional etiology.   # Seizure disorder: Patient was on Keppra 500 mg twice a day before coming to the hospital. With altered mental status, neurology  recommended increased dose of Keppra to 750 mg twice a day.  He will take as much as possible by mouth.  # Essential hypertension: Currently stable on isosorbide mononitrate.  Stable.  # Hyperlipidemia: On Zetia and rosuvastatin.  Discontinue.  # OSA on BiPAP: Rarely using.  #Goal of care:  Patient with chronic multiple medical issues, failure to thrive, poor chances of recovery and rehab and currently with no meaningful oral intake. Given all his medical issues and no improvement despite optimal medical therapy, a decision was made to treat him with comfort care and hospice. All symptom control medications available. DNR/DNI and hospice. Refer to residential hospice, transfer when bed available. RN to pronounce death if happens in the hospital.    DVT prophylaxis: Comfort care   Code Status: Comfort care Family Communication: Patient's son at the bedside Disposition Plan: Status is: Inpatient  Remains inpatient appropriate because:Unsafe d/c plan   Dispo:  Patient From: Home  Planned Disposition: Inpatient hospice  Medically stable for discharge: Yes          Consultants:   Neurology  IR  Palliative and hospice  Procedures:   L1, balloon kyphoplasty 5/10  Antimicrobials:   Rocephin 5/13---   Subjective: Patient seen and examined.  Obtunded.  Noisy breathing.  No meaningful change.  Minimally waking up on baby stimulation.  T-max 100.5 overnight. Family met with palliative care team and discussed about transition to hospice and transferred to inpatient hospice.  Objective: Vitals:   05/14/20 1901 05/14/20 2300 05/15/20 0300 05/15/20 0627  BP: (!) 149/64 134/69 (!) 126/54 (!) 123/52  Pulse: 100 87 85 83  Resp: (!) 29 (!) 28 (!) 27 19  Temp: (!) 100.5 F (38.1  C) 98.7 F (37.1 C) 98.6 F (37 C) 98 F (36.7 C)  TempSrc: Oral Axillary Axillary Axillary  SpO2: 97% 97% 96% 100%    Intake/Output Summary (Last 24 hours) at 05/15/2020 1328 Last  data filed at 05/15/2020 0042 Gross per 24 hour  Intake 1230 ml  Output --  Net 1230 ml   There were no vitals filed for this visit.  Examination:  General exam: Sick looking.  Obtunded.  Noisy respiration. Response to deep stimulation only, otherwise somnolent and sleepy. Respiratory system: Conducted airway sounds.  In mild distress.  On room air. Cardiovascular system: S1 & S2 heard, tachycardia.Marland Kitchen No JVD, murmurs, rubs, gallops or clicks. No pedal edema. Gastrointestinal system: Soft and nontender.   Data Reviewed: I have personally reviewed following labs and imaging studies  CBC: Recent Labs  Lab 05/10/20 0013 05/13/20 0413 05/15/20 0423  WBC 9.5 8.7 15.3*  NEUTROABS 6.3 6.0 11.8*  HGB 15.1 13.7 12.8*  HCT 45.2 40.8 39.6  MCV 87.4 86.6 89.6  PLT 167 192 240   Basic Metabolic Panel: Recent Labs  Lab 05/09/20 0315 05/10/20 0013 05/13/20 0413 05/15/20 0423  NA 138 134* 134*  --   K 3.8 3.9 3.9  --   CL 106 100 100  --   CO2 26 24 24   --   GLUCOSE 112* 105* 129*  --   BUN 13 14 16   --   CREATININE 0.87 0.92 0.99 1.11  CALCIUM 8.8* 8.9 8.3*  --   MG 2.1 1.9 2.0  --   PHOS 3.6  --   --   --    GFR: CrCl cannot be calculated (Unknown ideal weight.). Liver Function Tests: Recent Labs  Lab 05/10/20 0013  AST 22  ALT 12  ALKPHOS 45  BILITOT 1.3*  PROT 6.7  ALBUMIN 3.5   No results for input(s): LIPASE, AMYLASE in the last 168 hours. Recent Labs  Lab 05/10/20 0013  AMMONIA 27   Coagulation Profile: Recent Labs  Lab 05/10/20 0013  INR 1.1   Cardiac Enzymes: No results for input(s): CKTOTAL, CKMB, CKMBINDEX, TROPONINI in the last 168 hours. BNP (last 3 results) No results for input(s): PROBNP in the last 8760 hours. HbA1C: No results for input(s): HGBA1C in the last 72 hours. CBG: No results for input(s): GLUCAP in the last 168 hours. Lipid Profile: No results for input(s): CHOL, HDL, LDLCALC, TRIG, CHOLHDL, LDLDIRECT in the last 72  hours. Thyroid Function Tests: No results for input(s): TSH, T4TOTAL, FREET4, T3FREE, THYROIDAB in the last 72 hours. Anemia Panel: No results for input(s): VITAMINB12, FOLATE, FERRITIN, TIBC, IRON, RETICCTPCT in the last 72 hours. Sepsis Labs: Recent Labs  Lab 05/14/20 1531 05/14/20 1830 05/15/20 0423  PROCALCITON <0.10  --  0.11  LATICACIDVEN 2.2* 1.4  --     Recent Results (from the past 240 hour(s))  Resp Panel by RT-PCR (Flu A&B, Covid) Nasopharyngeal Swab     Status: None   Collection Time: 05/08/20 10:46 PM   Specimen: Nasopharyngeal Swab; Nasopharyngeal(NP) swabs in vial transport medium  Result Value Ref Range Status   SARS Coronavirus 2 by RT PCR NEGATIVE NEGATIVE Final    Comment: (NOTE) SARS-CoV-2 target nucleic acids are NOT DETECTED.  The SARS-CoV-2 RNA is generally detectable in upper respiratory specimens during the acute phase of infection. The lowest concentration of SARS-CoV-2 viral copies this assay can detect is 138 copies/mL. A negative result does not preclude SARS-Cov-2 infection and should not be used as the  sole basis for treatment or other patient management decisions. A negative result may occur with  improper specimen collection/handling, submission of specimen other than nasopharyngeal swab, presence of viral mutation(s) within the areas targeted by this assay, and inadequate number of viral copies(<138 copies/mL). A negative result must be combined with clinical observations, patient history, and epidemiological information. The expected result is Negative.  Fact Sheet for Patients:  EntrepreneurPulse.com.au  Fact Sheet for Healthcare Providers:  IncredibleEmployment.be  This test is no t yet approved or cleared by the [place] FDA and  has been authorized for detection and/or diagnosis of SARS-CoV-2 by FDA under an Emergency Use Authorization (EUA). This EUA will remain  in effect (meaning this test  can be used) for the duration of the COVID-19 declaration under Section 564(b)(1) of the Act, 21 U.S.C.section 360bbb-3(b)(1), unless the authorization is terminated  or revoked sooner.       Influenza A by PCR NEGATIVE NEGATIVE Final   Influenza B by PCR NEGATIVE NEGATIVE Final    Comment: (NOTE) The Xpert Xpress SARS-CoV-2/FLU/RSV plus assay is intended as an aid in the diagnosis of influenza from Nasopharyngeal swab specimens and should not be used as a sole basis for treatment. Nasal washings and aspirates are unacceptable for Xpert Xpress SARS-CoV-2/FLU/RSV testing.  Fact Sheet for Patients: EntrepreneurPulse.com.au  Fact Sheet for Healthcare Providers: IncredibleEmployment.be  This test is not yet approved or cleared by the [place] FDA and has been authorized for detection and/or diagnosis of SARS-CoV-2 by FDA under an Emergency Use Authorization (EUA). This EUA will remain in effect (meaning this test can be used) for the duration of the COVID-19 declaration under Section 564(b)(1) of the Act, 21 U.S.C. section 360bbb-3(b)(1), unless the authorization is terminated or revoked.  Performed at [facility], [place] [address], [place], [place] [address]   SARS CORONAVIRUS 2 (TAT 6-24 HRS) Nasopharyngeal Nasopharyngeal Swab     Status: None   Collection Time: 05/14/20  2:21 PM   Specimen: Nasopharyngeal Swab  Result Value Ref Range Status   SARS Coronavirus 2 NEGATIVE NEGATIVE Final    Comment: (NOTE) SARS-CoV-2 target nucleic acids are NOT DETECTED.  The SARS-CoV-2 RNA is generally detectable in upper and lower respiratory specimens during the acute phase of infection. Negative results do not preclude SARS-CoV-2 infection, do not rule out co-infections with other pathogens, and should not be used as the sole basis for treatment or other patient management decisions. Negative results must be combined with clinical  observations, patient history, and epidemiological information. The expected result is Negative.  Fact Sheet for Patients: SugarRoll.be  Fact Sheet for Healthcare Providers: https://www.woods-mathews.com/  This test is not yet approved or cleared by the [place] FDA and  has been authorized for detection and/or diagnosis of SARS-CoV-2 by FDA under an Emergency Use Authorization (EUA). This EUA will remain  in effect (meaning this test can be used) for the duration of the COVID-19 declaration under Se ction 564(b)(1) of the Act, 21 U.S.C. section 360bbb-3(b)(1), unless the authorization is terminated or revoked sooner.  Performed at [facility], [place] [address], [place], [place] [address]          Radiology Studies: DG CHEST PORT 1 VIEW  Result Date: 05/14/2020 CLINICAL DATA:  History of recent kyphoplasty with fevers, initial encounter EXAM: PORTABLE CHEST 1 VIEW COMPARISON:  None. FINDINGS: Cardiac shadow is enlarged in size. Aortic calcifications are noted. The lungs are well aerated bilaterally. No focal infiltrate is seen.  Changes of prior L1 kyphoplasty are noted. No acute bony abnormality is seen. IMPRESSION: No acute abnormality noted. Electronically Signed   By: Inez Catalina M.D.   On: 05/14/2020 16:24        Scheduled Meds: . glycopyrrolate  0.3 mg Intravenous TID  . levETIRAcetam  750 mg Oral BID  . scopolamine  1 patch Transdermal Q72H  . tamsulosin  0.4 mg Oral Daily   Continuous Infusions:   LOS: 7 days    Time spent: 35 minutes    Barb [place], MD [facility] Pager [phone]

## 2020-05-15 NOTE — Progress Notes (Signed)
   Referral received from Transitions of care. Reviewed chart and have reached out to discuss with pt son [name]. Left VM for return call to discuss possible transition from hospital to the Hospice home in [facility].  Will update after speaking to son about goals of care and wishes.  Webb Silversmith RN [phone]

## 2020-05-19 LAB — CULTURE, BLOOD (ROUTINE X 2)
Culture: NO GROWTH
Culture: NO GROWTH
Special Requests: ADEQUATE

## 2020-06-03 DEATH — deceased

## 2021-06-29 IMAGING — MR MR THORACIC SPINE W/O CM
4 of 6 series · 21 of 48 positions shown · non-contrast
Comparison: Lumbar spine radiographs 05/08/2020. Cervical spine
radiographs 01/29/2009

CLINICAL DATA: Back injury 2 days ago. Frequent falls over the last
2 weeks. Mid to low back pain.

EXAM:
MRI THORACIC AND LUMBAR SPINE WITHOUT CONTRAST
TECHNIQUE: Multiplanar and multiecho pulse sequences of the thoracic and lumbar
spine were obtained without intravenous contrast.

[Series 18: T1 · sagittal · 3.3mm · 0.62mm/px · 3 of 13 slices shown (1 of 2)]
[im 1/13]
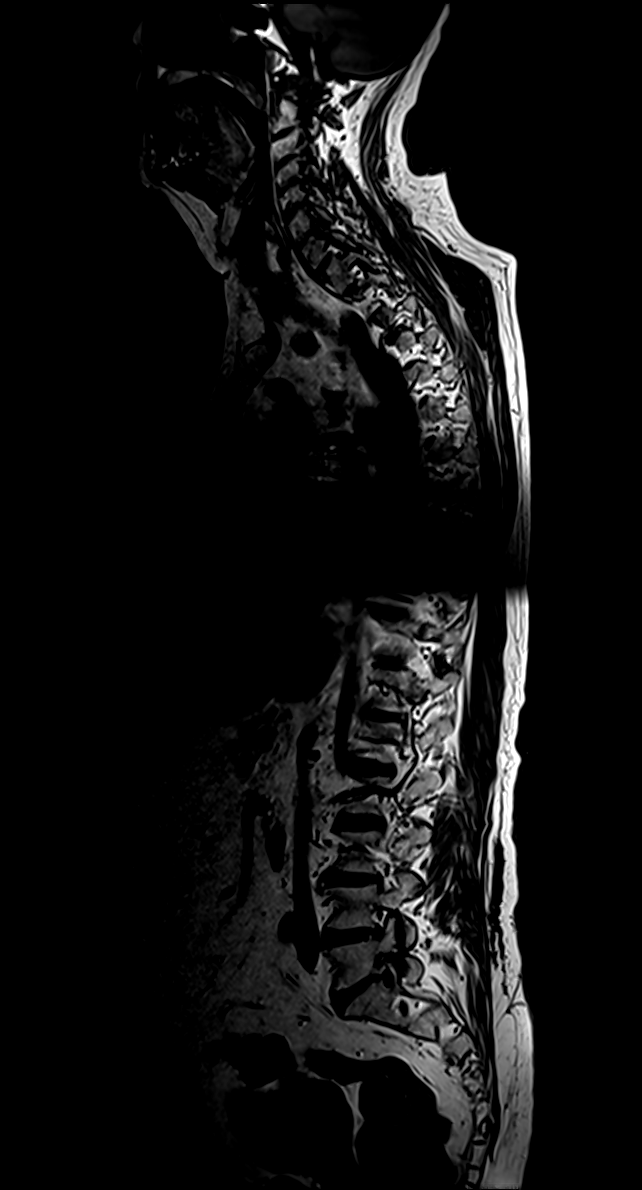
[im 9/13]
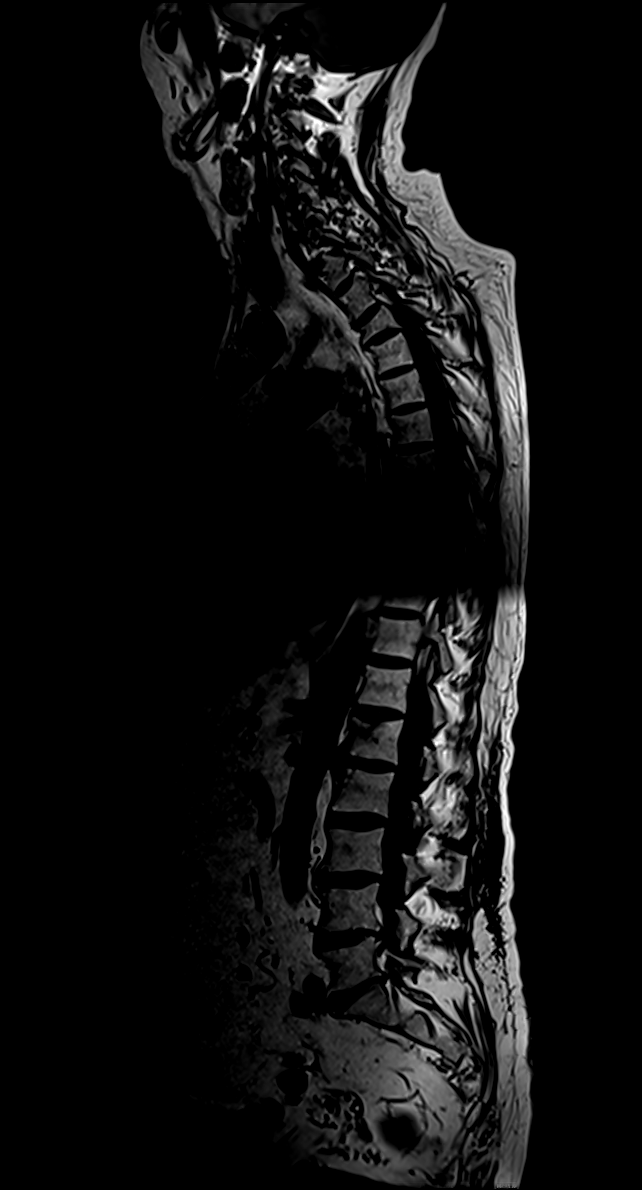
[im 13/13]
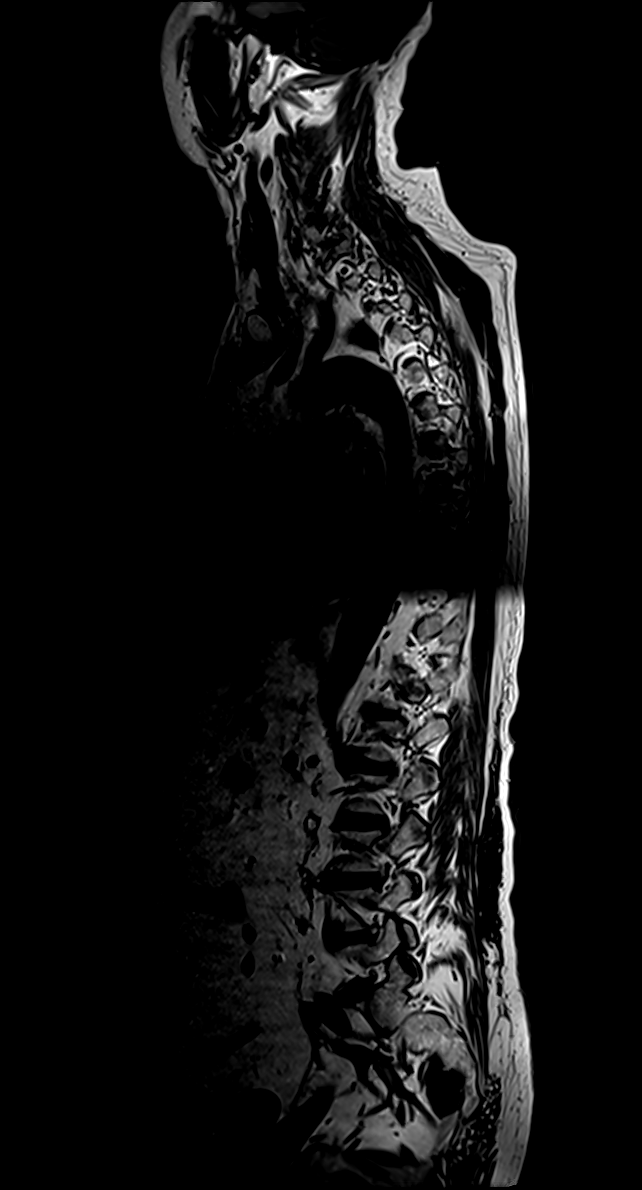

[Series 19: T2 · sagittal · 3.0mm · 0.76mm/px · 6 of 17 slices shown (1 of 2)]
[im 1/17]
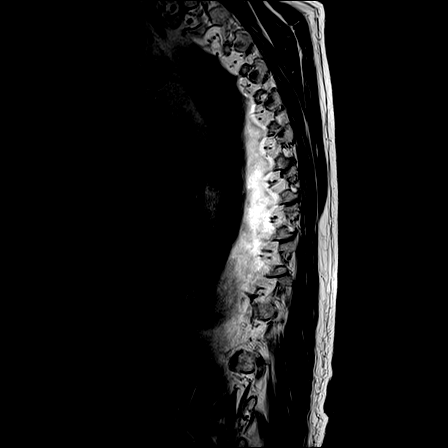
[im 4/17]
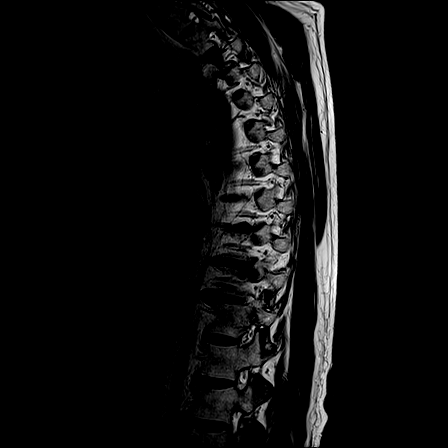
[im 7/17]
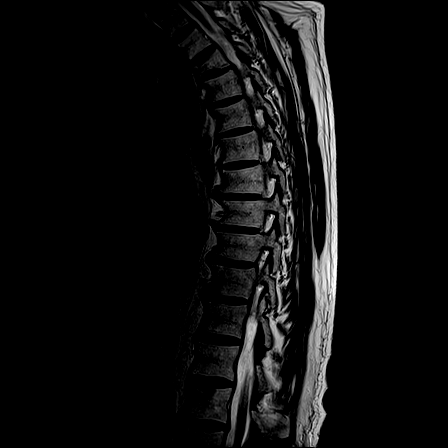
[im 10/17]
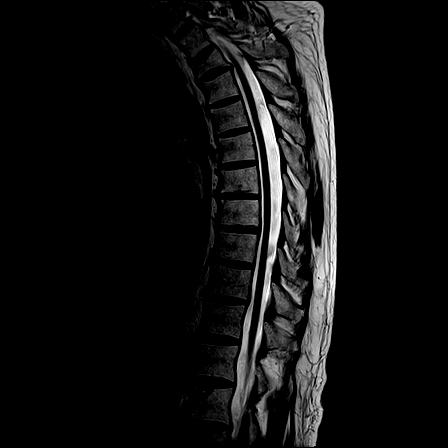
[im 13/17]
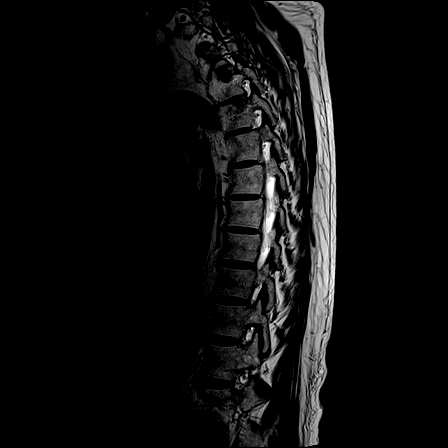
[im 17/17]
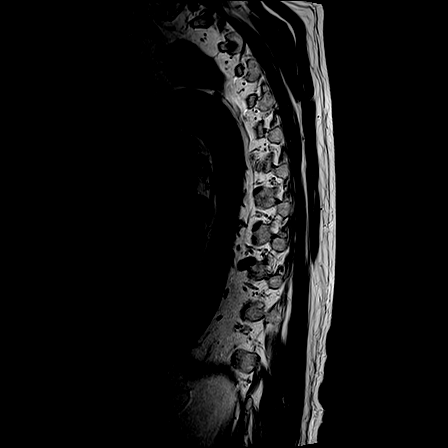

[Series 20: T1 · sagittal · 3.0mm · 0.82mm/px · 3 of 17 slices shown (2 of 2)]
[im 4/17]
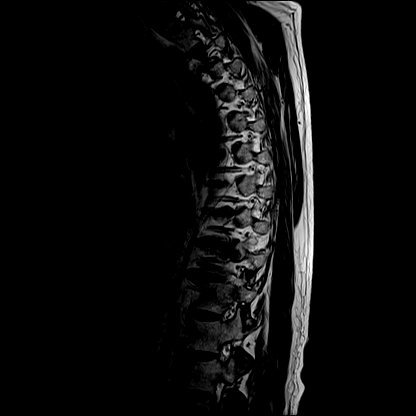
[im 10/17]
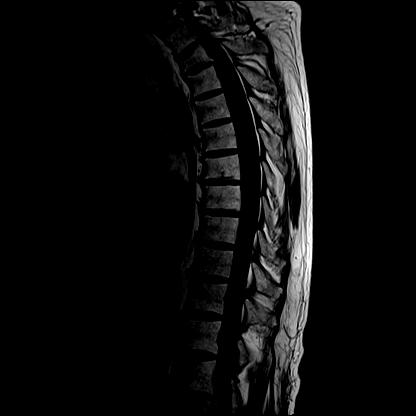
[im 17/17]
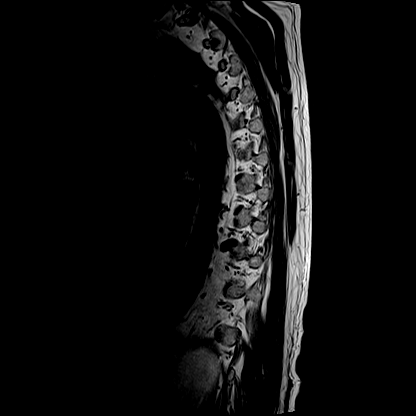

[Series 22: T2 · axial · 5.0mm · 0.62mm/px · z∈[-337,-121]mm · 9 of 36 slices shown (2 of 2)]
[im 1/36]
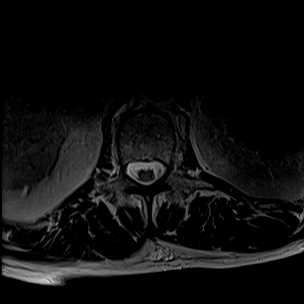
[im 6/36]
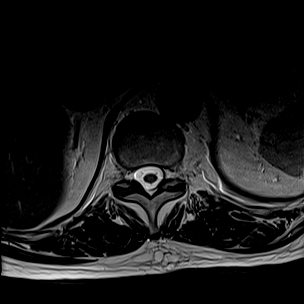
[im 12/36]
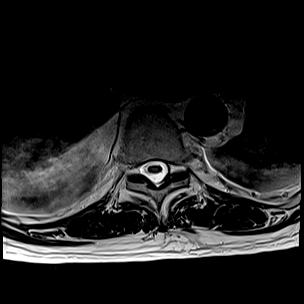
[im 15/36]
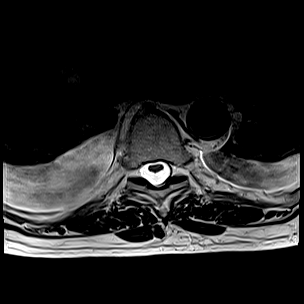
[im 18/36]
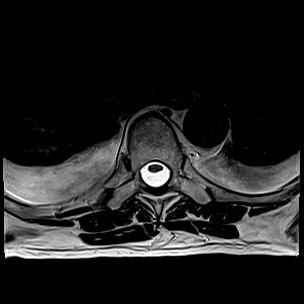
[im 21/36]
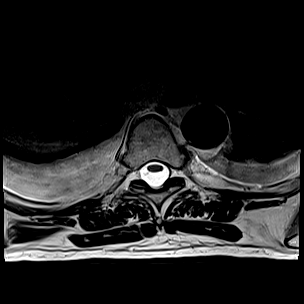
[im 24/36]
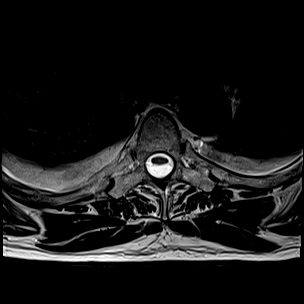
[im 30/36]
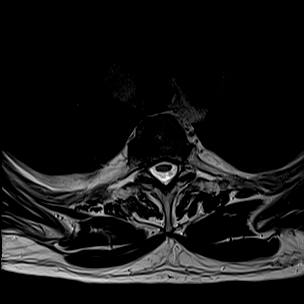
[im 36/36]
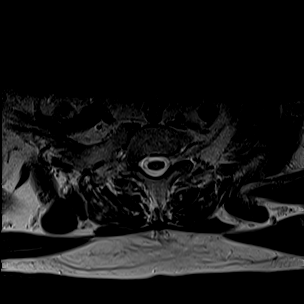

[21 of 48 positions shown; findings below may reference images not displayed]

FINDINGS: MRI THORACIC SPINE FINDINGS

Alignment:  Normal.

Vertebrae: There is a chronic appearing T3 compression deformity
with approximately 50% loss of vertebral body height and minimal
retropulsion of the inferior endplate. There is no residual bone
marrow edema. The posterior elements are intact. No acute fractures
are seen within the thoracic spine.

Cord: Normal in signal and caliber. Conus medullaris extends to the
L2 level.

Paraspinal and other soft tissues: No significant paraspinal
findings. Small right greater than left dependent pleural effusions.

Disc levels:

As above, mild osseous retropulsion at T3-4 related to the chronic
T3 compression deformity. No cord deformity or foraminal compromise.

Mild disc bulging and endplate osteophyte formation within the lower
thoracic spine without significant disc herniation, spinal stenosis
or nerve root encroachment.

MRI LUMBAR SPINE FINDINGS

Segmentation:  There are 5 lumbar type vertebral bodies.

Alignment:  Physiologic.

Vertebrae: There is superior endplate irregularity at L1 with
associated linear low signal and surrounding marrow edema in the L1
vertebral body, suspicious for an acute compression deformity. There
is also mild marrow edema within the anterior superior corner of the
L2 vertebral body which could reflect an additional fracture or
degenerative edema. The posterior elements are intact. There is no
osseous retropulsion.

Conus medullaris and cauda equina: Conus extends to the L2 level.
Conus and cauda equina appear normal.

Paraspinal and other soft tissues: Mild paraspinous edema anteriorly
at L1, supporting an acute fracture.

Disc levels:

L1-2: Loss of disc height with mild disc bulging and facet
hypertrophy. No spinal stenosis or nerve root encroachment.

L2-3: No significant findings.

L3-4: Mild loss of disc height with annular disc bulging and facet
hypertrophy. No significant spinal stenosis or nerve root
encroachment.

L4-5: Mild loss of disc height with annular disc bulging, facet and
ligamentous hypertrophy. Mild lateral recess and left foraminal
narrowing without definite nerve root encroachment.

L5-S1: Broad-based disc protrusion with covering spur in the right
subarticular zone and medial foramen. This causes asymmetric
narrowing of the right lateral recess and possible right S1 nerve
root encroachment. In addition, there is mild right foraminal
narrowing. Mild bilateral facet hypertrophy.
IMPRESSION: 1. Probable acute mild superior endplate compression fracture at L1
with associated marrow edema.
2. Possible minimal superior endplate compression fracture at L2.
3. No acute findings in the thoracic spine. Old healed T3
compression deformity.
4. Lumbar spondylosis as described with disc bulging, endplate
osteophytes and facet hypertrophy. At L4-5, the lateral recesses and
left foramen are mildly narrowed. At L5-S1, there is asymmetric
right lateral recess and right foraminal narrowing.
